# Patient Record
Sex: Female | Born: 1985
Health system: Southern US, Community
[De-identification: ages and names within clinical notes are randomized; demographics above are authoritative.]

## PROBLEM LIST (undated history)

## (undated) ENCOUNTER — Emergency Department (HOSPITAL_COMMUNITY): Payer: Self-pay | Source: Home / Self Care

## (undated) DIAGNOSIS — F329 Major depressive disorder, single episode, unspecified: Secondary | ICD-10-CM

## (undated) DIAGNOSIS — A6000 Herpesviral infection of urogenital system, unspecified: Secondary | ICD-10-CM

## (undated) DIAGNOSIS — F411 Generalized anxiety disorder: Secondary | ICD-10-CM

## (undated) HISTORY — DX: Major depressive disorder, single episode, unspecified: F32.9

## (undated) HISTORY — DX: Generalized anxiety disorder: F41.1

## (undated) HISTORY — PX: NO PAST SURGERIES: SHX2092

## (undated) HISTORY — DX: Herpesviral infection of urogenital system, unspecified: A60.00

---

## 2005-10-23 DIAGNOSIS — R87612 Low grade squamous intraepithelial lesion on cytologic smear of cervix (LGSIL): Secondary | ICD-10-CM

## 2005-10-23 HISTORY — DX: Low grade squamous intraepithelial lesion on cytologic smear of cervix (LGSIL): R87.612

## 2006-01-04 DIAGNOSIS — R87620 Atypical squamous cells of undetermined significance on cytologic smear of vagina (ASC-US): Secondary | ICD-10-CM

## 2006-01-04 HISTORY — PX: COLPOSCOPY: SHX161

## 2006-01-04 HISTORY — DX: Atypical squamous cells of undetermined significance on cytologic smear of vagina (ASC-US): R87.620

## 2008-03-02 ENCOUNTER — Ambulatory Visit: Payer: Self-pay | Admitting: General Surgery

## 2008-03-10 ENCOUNTER — Ambulatory Visit: Payer: Self-pay | Admitting: General Surgery

## 2010-10-05 LAB — HEPATIC FUNCTION PANEL
ALT: 15 U/L (ref 7–35)
AST: 12 U/L — AB (ref 13–35)
Alkaline Phosphatase: 44 U/L (ref 25–125)
Bilirubin, Total: 0.4 mg/dL

## 2010-10-05 LAB — LIPID PANEL
Cholesterol: 237 mg/dL — AB (ref 0–200)
HDL: 65 mg/dL (ref 35–70)
LDL Cholesterol: 145 mg/dL
LDL/HDL RATIO: 2.2
TRIGLYCERIDES: 134 mg/dL (ref 40–160)

## 2010-10-05 LAB — BASIC METABOLIC PANEL
BUN: 13 mg/dL (ref 4–21)
Creatinine: 0.8 mg/dL (ref 0.5–1.1)
Glucose: 86 mg/dL
Potassium: 4.9 mmol/L (ref 3.4–5.3)
SODIUM: 139 mmol/L (ref 137–147)

## 2014-04-23 LAB — CBC AND DIFFERENTIAL
HEMATOCRIT: 41 % (ref 36–46)
HEMOGLOBIN: 14.2 g/dL (ref 12.0–16.0)
NEUTROS ABS: 5 /uL
Platelets: 291 10*3/uL (ref 150–399)
WBC: 9 10^3/mL

## 2016-01-21 DIAGNOSIS — F32A Depression, unspecified: Secondary | ICD-10-CM | POA: Insufficient documentation

## 2016-01-21 DIAGNOSIS — F329 Major depressive disorder, single episode, unspecified: Secondary | ICD-10-CM

## 2016-01-21 DIAGNOSIS — F419 Anxiety disorder, unspecified: Secondary | ICD-10-CM | POA: Insufficient documentation

## 2016-01-24 ENCOUNTER — Encounter: Payer: Self-pay | Admitting: Family Medicine

## 2016-01-24 ENCOUNTER — Other Ambulatory Visit: Payer: Self-pay | Admitting: Family Medicine

## 2016-01-24 ENCOUNTER — Ambulatory Visit (INDEPENDENT_AMBULATORY_CARE_PROVIDER_SITE_OTHER): Payer: 59 | Admitting: Family Medicine

## 2016-01-24 VITALS — BP 118/70 | HR 64 | Temp 98.5°F | Resp 16 | Wt 234.0 lb

## 2016-01-24 DIAGNOSIS — R1011 Right upper quadrant pain: Secondary | ICD-10-CM

## 2016-01-24 DIAGNOSIS — F4323 Adjustment disorder with mixed anxiety and depressed mood: Secondary | ICD-10-CM | POA: Diagnosis not present

## 2016-01-24 LAB — POCT URINALYSIS DIPSTICK
Bilirubin, UA: NEGATIVE
Glucose, UA: NEGATIVE
Ketones, UA: NEGATIVE
LEUKOCYTES UA: NEGATIVE
NITRITE UA: NEGATIVE
PH UA: 5
PROTEIN UA: NEGATIVE
Spec Grav, UA: 1.02
UROBILINOGEN UA: 0.2

## 2016-01-24 LAB — POCT URINE PREGNANCY: PREG TEST UR: NEGATIVE

## 2016-01-24 MED ORDER — SERTRALINE HCL 50 MG PO TABS: 50.0000 mg | ORAL_TABLET | Freq: Every day | ORAL | Status: DC

## 2016-01-24 NOTE — Patient Instructions (Signed)
Major Depressive Disorder Major depressive disorder is a mental illness. It also may be called clinical depression or unipolar depression. Major depressive disorder usually causes feelings of sadness, hopelessness, or helplessness. Some people with this disorder do not feel particularly sad but lose interest in doing things they used to enjoy (anhedonia). Major depressive disorder also can cause physical symptoms. It can interfere with work, school, relationships, and other normal everyday activities. The disorder varies in severity but is longer lasting and more serious than the sadness we all feel from time to time in our lives. Major depressive disorder often is triggered by stressful life events or major life changes. Examples of these triggers include divorce, loss of your job or home, a move, and the death of a family member or close friend. Sometimes this disorder occurs for no obvious reason at all. People who have family members with major depressive disorder or bipolar disorder are at higher risk for developing this disorder, with or without life stressors. Major depressive disorder can occur at any age. It may occur just once in your life (single episode major depressive disorder). It may occur multiple times (recurrent major depressive disorder). SYMPTOMS People with major depressive disorder have either anhedonia or depressed mood on nearly a daily basis for at least 2 weeks or longer. Symptoms of depressed mood include:  Feelings of sadness (blue or down in the dumps) or emptiness.  Feelings of hopelessness or helplessness.  Tearfulness or episodes of crying (may be observed by others).  Irritability (children and adolescents). In addition to depressed mood or anhedonia or both, people with this disorder have at least four of the following symptoms:  Difficulty sleeping or sleeping too much.   Significant change (increase or decrease) in appetite or weight.   Lack of energy or  motivation.  Feelings of guilt and worthlessness.   Difficulty concentrating, remembering, or making decisions.  Unusually slow movement (psychomotor retardation) or restlessness (as observed by others).   Recurrent wishes for death, recurrent thoughts of self-harm (suicide), or a suicide attempt. People with major depressive disorder commonly have persistent negative thoughts about themselves, other people, and the world. People with severe major depressive disorder may experiencedistorted beliefs or perceptions about the world (psychotic delusions). They also may see or hear things that are not real (psychotic hallucinations). DIAGNOSIS Major depressive disorder is diagnosed through an assessment by your health care provider. Your health care provider will ask aboutaspects of your daily life, such as mood,sleep, and appetite, to see if you have the diagnostic symptoms of major depressive disorder. Your health care provider may ask about your medical history and use of alcohol or drugs, including prescription medicines. Your health care provider also may do a physical exam and blood work. This is because certain medical conditions and the use of certain substances can cause major depressive disorder-like symptoms (secondary depression). Your health care provider also may refer you to a mental health specialist for further evaluation and treatment. TREATMENT It is important to recognize the symptoms of major depressive disorder and seek treatment. The following treatments can be prescribed for this disorder:   Medicine. Antidepressant medicines usually are prescribed. Antidepressant medicines are thought to correct chemical imbalances in the brain that are commonly associated with major depressive disorder. Other types of medicine may be added if the symptoms do not respond to antidepressant medicines alone or if psychotic delusions or hallucinations occur.  Talk therapy. Talk therapy can be  helpful in treating major depressive disorder by providing   support, education, and guidance. Certain types of talk therapy also can help with negative thinking (cognitive behavioral therapy) and with relationship issues that trigger this disorder (interpersonal therapy). A mental health specialist can help determine which treatment is best for you. Most people with major depressive disorder do well with a combination of medicine and talk therapy. Treatments involving electrical stimulation of the brain can be used in situations with extremely severe symptoms or when medicine and talk therapy do not work over time. These treatments include electroconvulsive therapy, transcranial magnetic stimulation, and vagal nerve stimulation.   This information is not intended to replace advice given to you by your health care provider. Make sure you discuss any questions you have with your health care provider.   Document Released: 02/03/2013 Document Revised: 10/30/2014 Document Reviewed: 02/03/2013 Elsevier Interactive Patient Education 2016 [employer] Inc[employer] Generalized Anxiety Disorder Generalized anxiety disorder (GAD) is a mental disorder. It interferes with life functions, including relationships, work, and school. GAD is different from normal anxiety, which everyone experiences at some point in their lives in response to specific life events and activities. Normal anxiety actually helps [place] prepare for and get through these life events and activities. Normal anxiety goes away after the event or activity is over.  GAD causes anxiety that is not necessarily related to specific events or activities. It also causes excess anxiety in proportion to specific events or activities. The anxiety associated with GAD is also difficult to control. GAD can vary from mild to severe. People with severe GAD can have intense waves of anxiety with physical symptoms (panic attacks).  SYMPTOMS The anxiety and worry associated with GAD  are difficult to control. This anxiety and worry are related to many life events and activities and also occur more days than not for 6 months or longer. People with GAD also have three or more of the following symptoms (one or more in children):  Restlessness.   Fatigue.  Difficulty concentrating.   Irritability.  Muscle tension.  Difficulty sleeping or unsatisfying sleep. DIAGNOSIS GAD is diagnosed through an assessment by your health care provider. Your health care provider will ask you questions aboutyour mood,physical symptoms, and events in your life. Your health care provider may ask you about your medical history and use of alcohol or drugs, including prescription medicines. Your health care provider may also do a physical exam and blood tests. Certain medical conditions and the use of certain substances can cause symptoms similar to those associated with GAD. Your health care provider may refer you to a mental health specialist for further evaluation. TREATMENT The following therapies are usually used to treat GAD:   Medication. Antidepressant medication usually is prescribed for long-term daily control. Antianxiety medicines may be added in severe cases, especially when panic attacks occur.   Talk therapy (psychotherapy). Certain types of talk therapy can be helpful in treating GAD by providing support, education, and guidance. A form of talk therapy called cognitive behavioral therapy can teach you healthy ways to think about and react to daily life events and activities.  Stress managementtechniques. These include yoga, meditation, and exercise and can be very helpful when they are practiced regularly. A mental health specialist can help determine which treatment is best for you. Some people see improvement with one therapy. However, other people require a combination of therapies.   This information is not intended to replace advice given to you by your health care  provider. Make sure you discuss any questions you have with your  with your health care provider. °  °Document Released: 02/03/2013 Document Revised: 10/30/2014 Document Reviewed: 02/03/2013 °Elsevier Interactive Patient Education ©2016 Elsevier Inc. ° °

## 2016-01-24 NOTE — Progress Notes (Signed)
Patient ID: Monique Mcmahon, female   DOB: [birth year], 30 y.o.   MRN: [id number]       Patient: Monique Mcmahon Female    DOB: 11-05-1985   30 y.o.   MRN: [id number] Visit Date: 01/24/2016  Today's Provider: Vernie Murders, PA   Chief Complaint  Patient presents with  . Emesis   Subjective:    HPI Patient comes in office today with concerns of vomiting. Patient reports that episodes have been intermittent, she denies any certain foods or stress or G.I upset triggering these episodes. Patient reports last incident occurred a week ago after drinking a lime flavored beverage. Patient denies any history of acid reflux.    History reviewed. No pertinent past medical history. Patient Active Problem List   Diagnosis Date Noted  . Acute anxiety 01/21/2016  . Depression 01/21/2016   Past Surgical History  Procedure Laterality Date  . No past surgeries     Family History  Problem Relation Age of Onset  . Anemia Mother   . Ulcerative colitis Sister   . Parkinson's disease Maternal Grandmother   . Prostate cancer Maternal Grandfather    No Known Allergies Previous Medications   ETONOGESTREL-ETHINYL ESTRADIOL (NUVARING) 0.12-0.015 MG/24HR VAGINAL RING    Place 1 each vaginally every 28 (twenty-eight) days. Insert vaginally and leave in place for 3 consecutive weeks, then remove for 1 week.    Review of Systems  Constitutional: Negative.   HENT: Negative.   Eyes: Negative.   Respiratory: Negative.   Cardiovascular: Negative.   Endocrine: Negative.   Genitourinary: Positive for flank pain. Negative for dysuria, urgency, frequency, hematuria, decreased urine volume, vaginal bleeding, vaginal discharge, enuresis, difficulty urinating, genital sores, vaginal pain, menstrual problem, pelvic pain and dyspareunia.  Musculoskeletal: Positive for back pain. Negative for myalgias, joint swelling, arthralgias, gait problem and neck stiffness.  Skin: Negative.   Allergic/Immunologic:  Negative.   Neurological: Negative.   Hematological: Negative.   Psychiatric/Behavioral: Negative.     Social History  Substance Use Topics  . Smoking status: Current Every Day Smoker -- 0.50 packs/day    Types: Cigarettes  . Smokeless tobacco: Never Used  . Alcohol Use: Not on file   Objective:   BP 118/70 mmHg  Pulse 64  Temp(Src) 98.5 F (36.9 C) (Oral)  Resp 16  Wt 234 lb (106.142 kg)  LMP 01/24/2016  Physical Exam  Constitutional: She is oriented to person, place, and time. She appears well-developed and well-nourished.  HENT:  Head: Normocephalic.  Right Ear: External ear normal.  Left Ear: External ear normal.  Mouth/Throat: Oropharynx is clear and moist.  Eyes: Conjunctivae and EOM are normal.  Neck: Neck supple. No thyromegaly present.  Cardiovascular: Normal rate, regular rhythm and normal heart sounds.   Pulmonary/Chest: Breath sounds normal.  Abdominal: Soft. Bowel sounds are normal. There is tenderness.  Mild tenderness in RUQ to deep palpation. BS wnl. No masses.  Neurological: She is alert and oriented to person, place, and time.  Psychiatric: Her behavior is normal. Thought content normal. Her mood appears anxious.      Assessment & Plan:     1. RUQ abdominal pain Onset with intermittent episodes of vomiting over the past 2 months. No dyspepsia, hematemesis, melena, gas, diarrhea or nausea. Vomiting usually occurs with triggering gag reflex accidentally without RUQ pain. No association with food or meal time. LMP started today - using Nuvaring the past couple years. Tender in RUQ and worried about family history - mother had GB disease and  sister had total colectomy for ulcerative colitis. May use Bonine/Meclizine prn nausea and Zantac if any dyspepsia. Schedule labs and RUQ ultrasound.  - Comprehensive metabolic panel - CBC with Differential/Platelet - US Abdomen Limited RUQ - Lipase - POCT urinalysis dipstick - POCT urine pregnancy  2. Adjustment  reaction with anxiety and depression Onset over the past 2 months. Having very anxious episodes and sadness with spontaneous crying spells. Sleeping 10 hours a night but interrupted. 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 Denies suicidal ideation/planning. Will treat with Zoloft and check routine labs for metabolic disorder. - TSH - sertraline (ZOLOFT) 50 MG tablet; Take 1 tablet (50 mg total) by mouth daily.  Dispense: 30 tablet; Refill: [place], [facility]

## 2016-01-25 LAB — COMPREHENSIVE METABOLIC PANEL
A/G RATIO: 1.5 (ref 1.2–2.2)
ALT: 14 IU/L (ref 0–32)
AST: 17 IU/L (ref 0–40)
Albumin: 4.2 g/dL (ref 3.5–5.5)
Alkaline Phosphatase: 37 IU/L — ABNORMAL LOW (ref 39–117)
BUN/Creatinine Ratio: 14 (ref 9–23)
BUN: 10 mg/dL (ref 6–20)
Bilirubin Total: 0.2 mg/dL (ref 0.0–1.2)
CALCIUM: 9.2 mg/dL (ref 8.7–10.2)
CO2: 20 mmol/L (ref 18–29)
CREATININE: 0.69 mg/dL (ref 0.57–1.00)
Chloride: 103 mmol/L (ref 96–106)
GFR calc Af Amer: 135 mL/min/{1.73_m2} (ref >59)
GFR, EST NON AFRICAN AMERICAN: 117 mL/min/{1.73_m2} (ref >59)
GLOBULIN, TOTAL: 2.8 g/dL (ref 1.5–4.5)
Glucose: 87 mg/dL (ref 65–99)
POTASSIUM: 5 mmol/L (ref 3.5–5.2)
SODIUM: 139 mmol/L (ref 134–144)
TOTAL PROTEIN: 7 g/dL (ref 6.0–8.5)

## 2016-01-25 LAB — CBC WITH DIFFERENTIAL/PLATELET
BASOS: 0 %
Basophils Absolute: 0 10*3/uL (ref 0.0–0.2)
EOS (ABSOLUTE): 0.2 10*3/uL (ref 0.0–0.4)
EOS: 3 %
Hematocrit: 40.2 % (ref 34.0–46.6)
Hemoglobin: 13.6 g/dL (ref 11.1–15.9)
IMMATURE GRANS (ABS): 0 10*3/uL (ref 0.0–0.1)
IMMATURE GRANULOCYTES: 0 %
LYMPHS: 38 %
Lymphocytes Absolute: 2.6 10*3/uL (ref 0.7–3.1)
MCH: 31 pg (ref 26.6–33.0)
MCHC: 33.8 g/dL (ref 31.5–35.7)
MCV: 92 fL (ref 79–97)
MONOS ABS: 0.4 10*3/uL (ref 0.1–0.9)
Monocytes: 6 %
NEUTROS PCT: 53 %
Neutrophils Absolute: 3.6 10*3/uL (ref 1.4–7.0)
PLATELETS: 231 10*3/uL (ref 150–379)
RBC: 4.39 x10E6/uL (ref 3.77–5.28)
RDW: 13.2 % (ref 12.3–15.4)
WBC: 6.9 10*3/uL (ref 3.4–10.8)

## 2016-01-25 LAB — TSH: TSH: 0.891 u[IU]/mL (ref 0.450–4.500)

## 2016-01-25 LAB — LIPASE: LIPASE: 19 U/L (ref 0–59)

## 2016-01-26 ENCOUNTER — Telehealth: Payer: Self-pay

## 2016-01-26 ENCOUNTER — Ambulatory Visit
Admission: RE | Admit: 2016-01-26 | Discharge: 2016-01-26 | Disposition: A | Discharge status: Home / Self Care | Payer: 59 | Source: Ambulatory Visit | Attending: Family Medicine | Admitting: Family Medicine

## 2016-01-26 DIAGNOSIS — R1011 Right upper quadrant pain: Secondary | ICD-10-CM | POA: Diagnosis not present

## 2016-01-26 NOTE — Telephone Encounter (Signed)
-----   Message from Margo Common, [place] sent at 01/26/2016  3:36 PM EDT ----- Normal ultrasound without signs of gallstones or gallbladder disease. May use Zantac 150 mg BID and Gas-X for gas with Probiotic supplement for better digestion. If abdominal discomfort persists after using this treatment for a week, should schedule for referral to gastroenterologist.

## 2016-01-26 NOTE — Telephone Encounter (Signed)
LMTCB

## 2016-01-26 NOTE — Telephone Encounter (Signed)
-----   Message from Margo Common, [place] sent at 01/26/2016  9:09 AM EDT ----- All blood tests normal. Proceed with ultrasound study.

## 2016-01-27 NOTE — Telephone Encounter (Signed)
Patient advised as directed below. Patient verbalized understanding and agrees with plan of care.  

## 2016-04-06 IMAGING — US US ABDOMEN LIMITED
1 series · 14 of 25 positions shown · non-contrast
Comparison: No prior.

CLINICAL DATA: Right upper quadrant pain.  Vomiting.

EXAM:
US ABDOMEN LIMITED - RIGHT UPPER QUADRANT

[Series 1: us abdomen limited · 0.26mm/px · 14 of 54 slices shown]
[im 1/54]
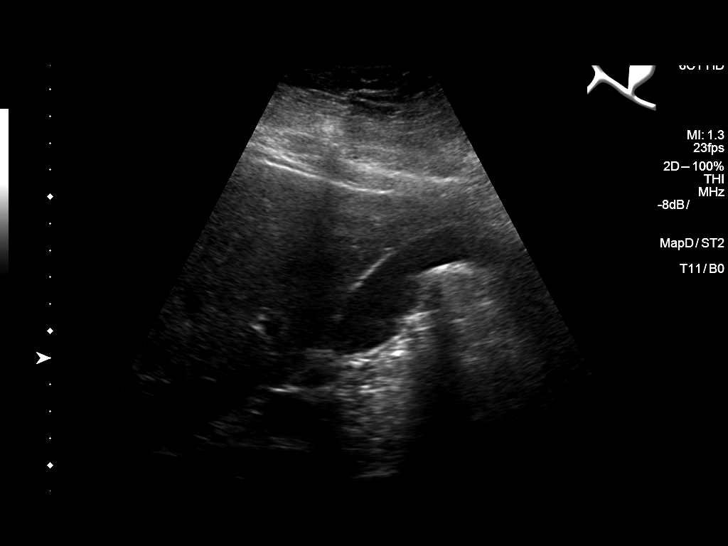
[im 5/54]
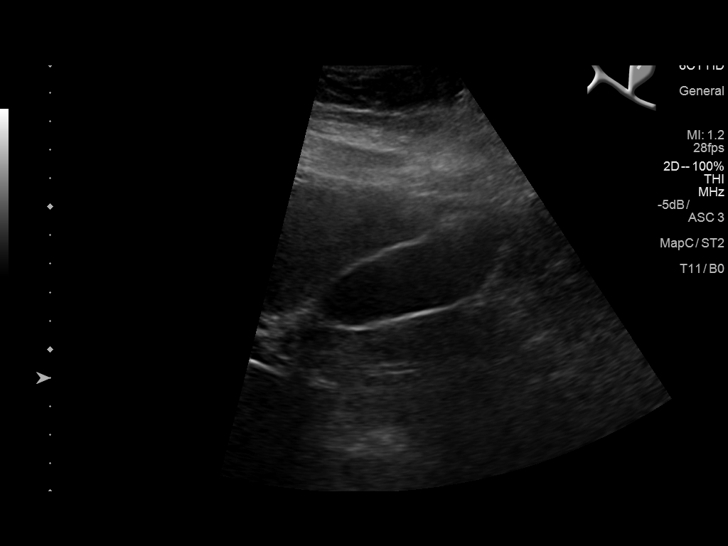
[im 9/54]
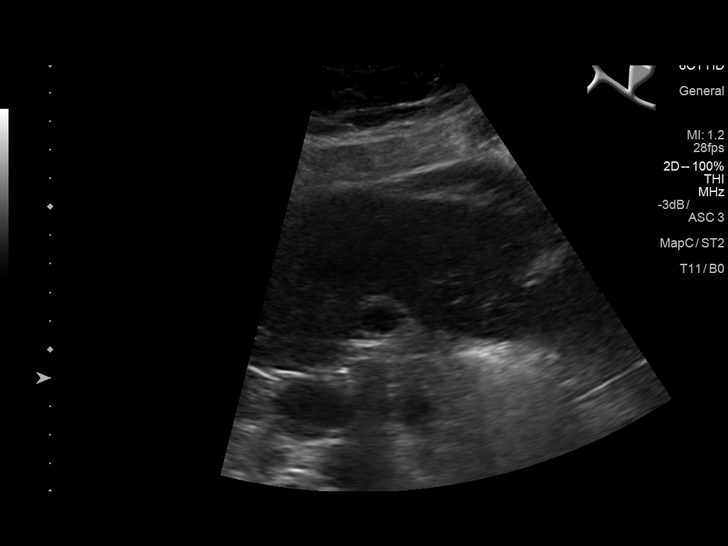
[im 14/54]
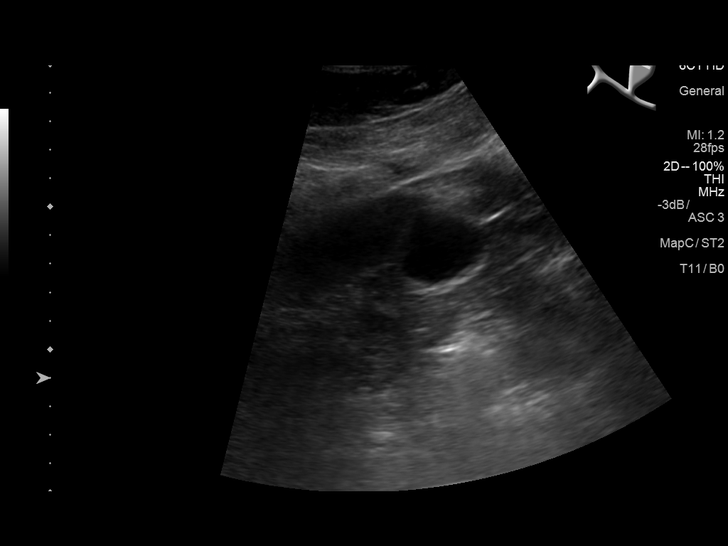
[im 18/54]
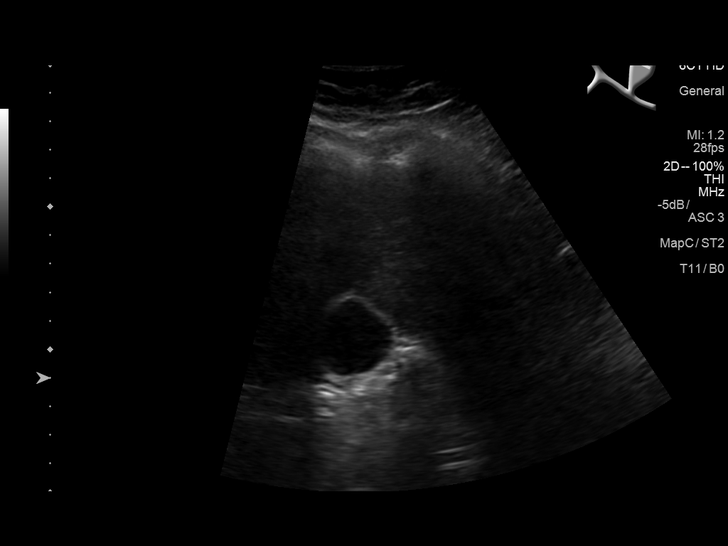
[im 20/54]
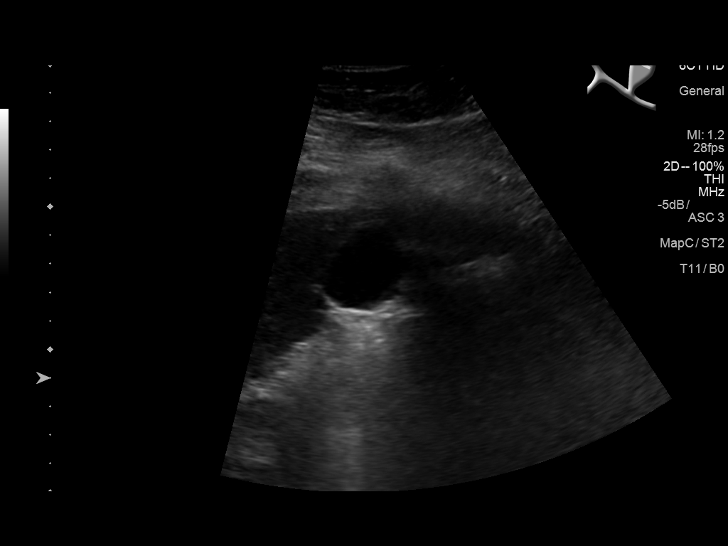
[im 25/54]
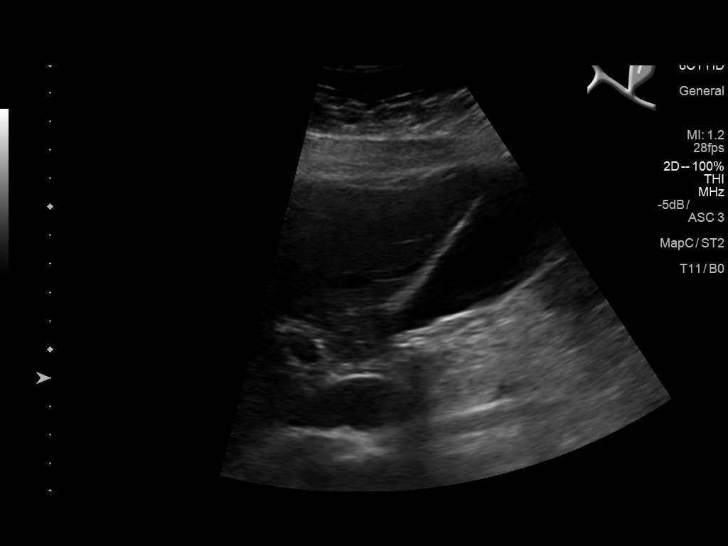
[im 29/54]
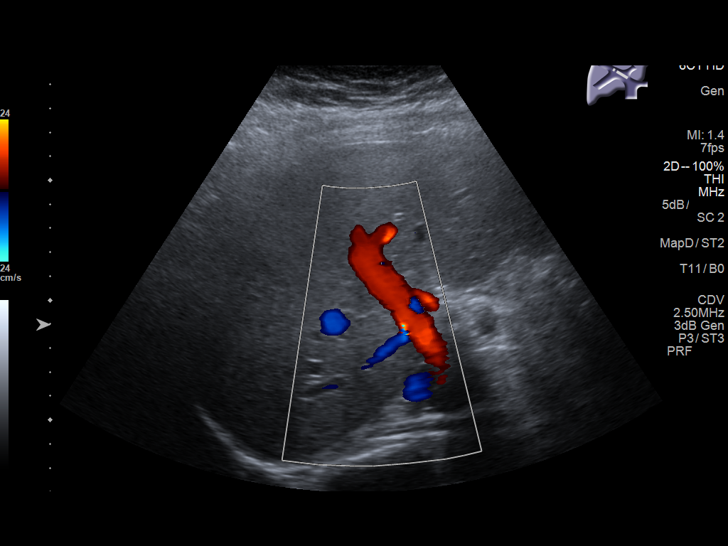
[im 34/54]
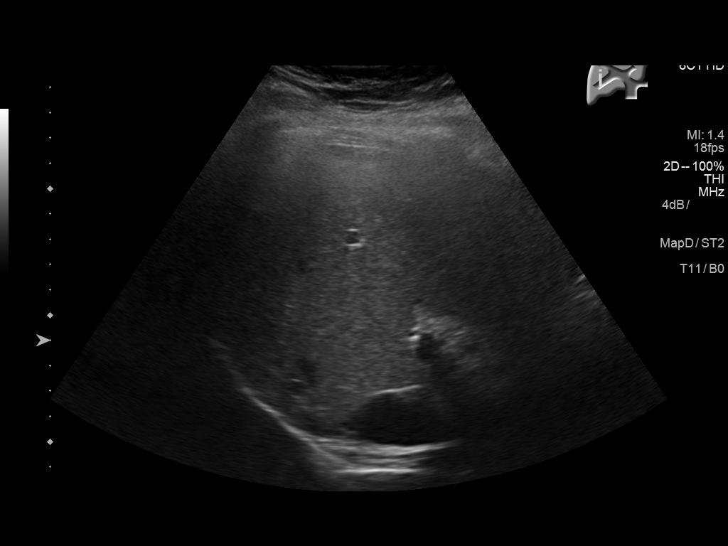
[im 36/54]
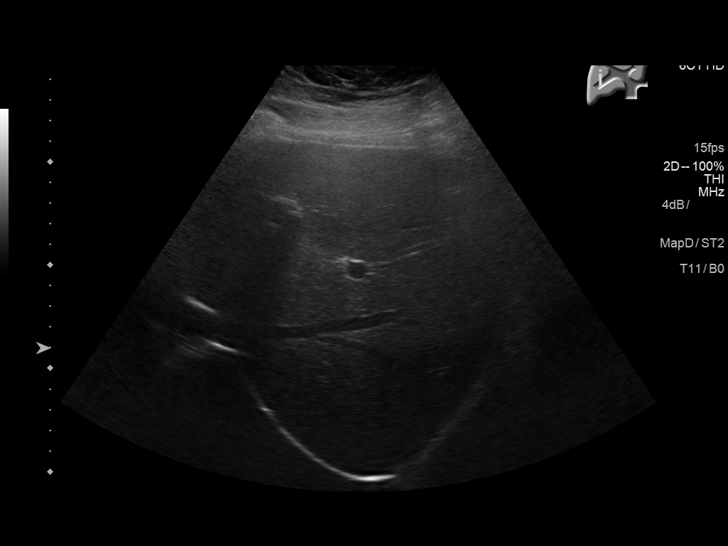
[im 40/54]
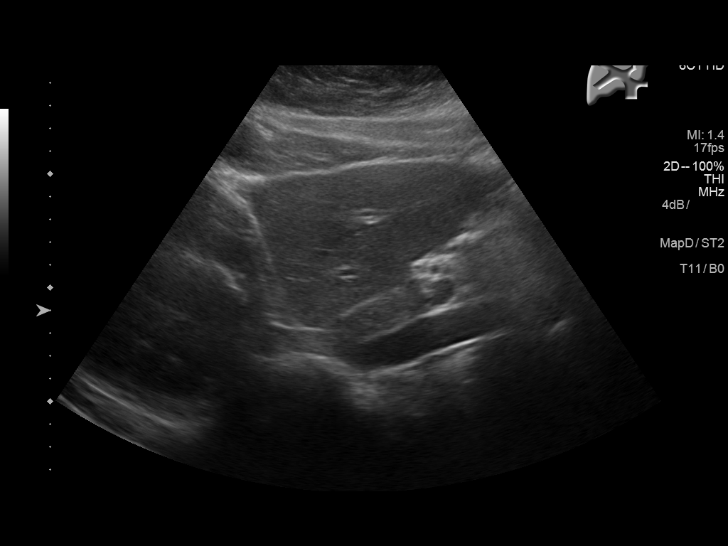
[im 45/54]
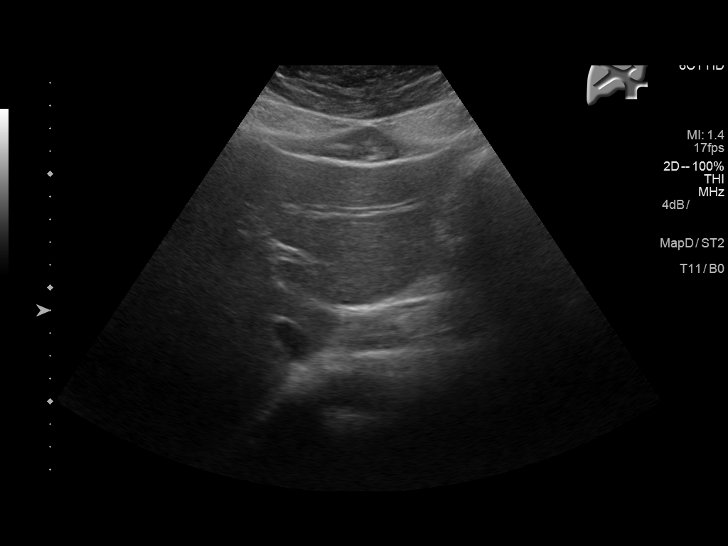
[im 49/54]
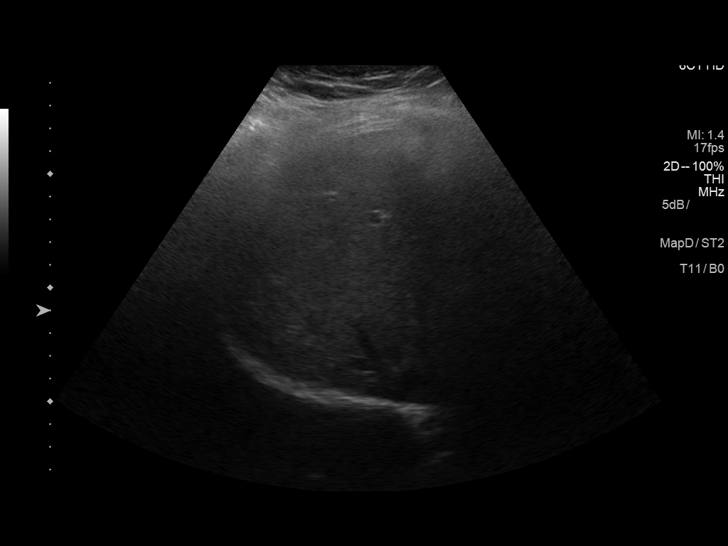
[im 54/54]
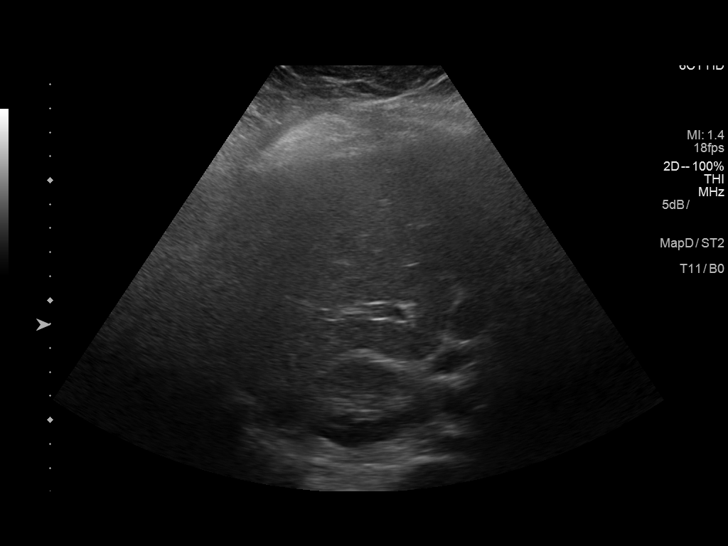

[14 of 25 positions shown; findings below may reference images not displayed]

FINDINGS: Gallbladder:

No gallstones or wall thickening visualized. No sonographic Murphy
sign noted by sonographer.

Common bile duct:

Diameter: 2.7 mm

Liver:

No focal lesion identified. Within normal limits in parenchymal
echogenicity.
IMPRESSION: Negative exam.

## 2016-10-24 ENCOUNTER — Encounter: Payer: Self-pay | Admitting: Physician Assistant

## 2016-10-24 ENCOUNTER — Ambulatory Visit (INDEPENDENT_AMBULATORY_CARE_PROVIDER_SITE_OTHER): Payer: 59 | Admitting: Physician Assistant

## 2016-10-24 VITALS — BP 128/82 | HR 68 | Temp 98.5°F | Resp 16 | Wt 229.0 lb

## 2016-10-24 DIAGNOSIS — Z9109 Other allergy status, other than to drugs and biological substances: Secondary | ICD-10-CM | POA: Diagnosis not present

## 2016-10-24 DIAGNOSIS — R21 Rash and other nonspecific skin eruption: Secondary | ICD-10-CM | POA: Diagnosis not present

## 2016-10-24 MED ORDER — PREDNISONE 10 MG (21) PO TBPK: ORAL_TABLET | ORAL | 0 refills | Status: DC

## 2016-10-24 NOTE — Patient Instructions (Signed)
Rash A rash is a change in the color of the skin. A rash can also change the way your skin feels. There are many different conditions and factors that can cause a rash. Follow these instructions at home: Pay attention to any changes in your symptoms. Follow these instructions to help with your condition: Medicine Take or apply over-the-counter and prescription medicines only as told by your health care provider. These may include:  Corticosteroid cream.  Anti-itch lotions.  Oral antihistamines.  Skin Care  Apply cool compresses to the affected areas.  Try taking a bath with: ? Epsom salts. Follow the instructions on the packaging. You can get these at your local pharmacy or grocery store. ? Baking soda. Pour a small amount into the bath as told by your health care provider. ? Colloidal oatmeal. Follow the instructions on the packaging. You can get this at your local pharmacy or grocery store.  Try applying baking soda paste to your skin. Stir water into baking soda until it reaches a paste-like consistency.  Do not scratch or rub your skin.  Avoid covering the rash. Make sure the rash is exposed to air as much as possible. General instructions  Avoid hot showers or baths, which can make itching worse. A cold shower may help.  Avoid scented soaps, detergents, and perfumes. Use gentle soaps, detergents, perfumes, and other cosmetic products.  Avoid any substance that causes your rash. Keep a journal to help track what causes your rash. Write down: ? What you eat. ? What cosmetic products you use. ? What you drink. ? What you wear. This includes jewelry.  Keep all follow-up visits as told by your health care provider. This is important. Contact a health care provider if:  You sweat at night.  You lose weight.  You urinate more than normal.  You feel weak.  You vomit.  Your skin or the whites of your eyes look yellow (jaundice).  Your skin: ? Tingles. ? Is  numb.  Your rash: ? Does not go away after several days. ? Gets worse.  You are: ? Unusually thirsty. ? More tired than normal.  You have: ? New symptoms. ? Pain in your abdomen. ? A fever. ? Diarrhea. Get help right away if:  You develop a rash that covers all or most of your body. The rash may or may not be painful.  You develop blisters that: ? Are on top of the rash. ? Grow larger or grow together. ? Are painful. ? Are inside your nose or mouth.  You develop a rash that: ? Looks like purple pinprick-sized spots all over your body. ? Has a "bull's eye" or looks like a target. ? Is not related to sun exposure, is red and painful, and causes your skin to peel. This information is not intended to replace advice given to you by your health care provider. Make sure you discuss any questions you have with your health care provider. Document Released: 09/29/2002 Document Revised: 03/14/2016 Document Reviewed: 02/24/2015 Elsevier Interactive Patient Education  2017 [employer].  

## 2016-10-24 NOTE — Progress Notes (Signed)
Patient: Monique Mcmahon Female    DOB: [birth year]   31 y.o.   MRN: [id number] Visit Date: 10/24/2016  Today's Provider: Trinna Post, PA-C   Chief Complaint  Patient presents with  . Rash    Started Saturday   Subjective:    Rash  This is a new problem. The current episode started in the past 7 days. The problem has been gradually worsening since onset. The affected locations include the back, abdomen and left upper leg. The rash is characterized by redness and itchiness. She was exposed to nothing. Associated symptoms include congestion.   Patient is a 31 y/o female on Nuvaring and Zoloft presenting to the clinic with a rash ongoing for four days. She says the rash started as a small bump on her back and then spread to her abdomen and legs. It is extremely itchy. 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 There is no pus or weeping. She has never had this before. She has some URI symptoms currently but not prior to this. She has been taking Benadryl with some relief. Rash is not painful.   Also would like to see if she can see allergist because she has new allergies since college such as dogs that she never had before and is wondering if this could be contributing to her rash.     No Known Allergies   Current Outpatient Prescriptions:  .  etonogestrel-ethinyl estradiol (NUVARING) 0.12-0.015 MG/24HR vaginal ring, Place 1 each vaginally every 28 (twenty-eight) days. Insert vaginally and leave in place for 3 consecutive weeks, then remove for 1 week., Disp: , Rfl:  .  predniSONE (STERAPRED UNI-PAK 21 TAB) 10 MG (21) TBPK tablet, Take 6, 5, 4, 3, 2, 1., Disp: 21 tablet, Rfl: 0 .  sertraline (ZOLOFT) 50 MG tablet, Take 1 tablet (50 mg total) by mouth daily. (Patient not taking: Reported on 10/24/2016), Disp: 30 tablet, Rfl: 3  Review of Systems  Constitutional: Negative.   HENT: Positive  for congestion.   Musculoskeletal: Positive for myalgias.  Skin: Positive for rash. Negative for color change, pallor and wound.    Social History  Substance Use Topics  . Smoking status: Current Every Day Smoker    Packs/day: 0.50    Types: Cigarettes  . Smokeless tobacco: Never Used  . Alcohol use Not on file   Objective:   BP 128/82 (BP Location: Left Arm, Patient Position: Sitting, Cuff Size: Large)   Pulse 68   Temp 98.5 F (36.9 C) (Oral)   Resp 16   Wt 229 lb (103.9 kg)   LMP 09/22/2016   BMI 36.41 kg/m   Physical Exam  Constitutional: She is oriented to person, place, and time. She appears well-developed and well-nourished.  Lymphadenopathy:    She has no cervical adenopathy.  Neurological: She is alert and oriented to person, place, and time.  Skin: Skin is warm and dry. Rash noted. There is erythema.  Maculopapular rash concentrated on lumbar back but also present throughout entire trunk and bilateral legs. Uniformly red, non-scaling maculopapular lesions ranging in size from 0.5cm to 1.5 cm in diameter. No central clearing. Do not appear urticarial. Some evidence of excoriation. No dermatomal pattern. No bleeding, weeping, or pus drainage.   Psychiatric: She has a normal mood and affect. Her behavior is normal.        Assessment & Plan:  1. Maculopapular rash  Unclear etiology. Does not appear viral in nature. No dermatomal pattern to indicate shingles or along cleavage lines to indicate pityriasis rosea. No scaling to indicate guttate psoriasis or eczema. Will give steroid taper to see if this reduces inflammation and itching. Patient to call back if worsening.   - predniSONE (STERAPRED UNI-PAK 21 TAB) 10 MG (21) TBPK tablet; Take 6, 5, 4, 3, 2, 1.  Dispense: 21 tablet; Refill: 0  2. Environmental allergies  Will refer as below for environment of allergies and also investigation if rash may be related.   - Ambulatory referral to Allergy  No Follow-up on  file.   Patient Instructions  Rash A rash is a change in the color of the skin. A rash can also change the way your skin feels. There are many different conditions and factors that can cause a rash. Follow these instructions at home: Pay attention to any changes in your symptoms. Follow these instructions to help with your condition: Medicine  Take or apply over-the-counter and prescription medicines only as told by your health care provider. These may include:  Corticosteroid cream.  Anti-itch lotions.  Oral antihistamines. Skin Care  Apply cool compresses to the affected areas.  Try taking a bath with:  Epsom salts. Follow the instructions on the packaging. You can get these at your local pharmacy or grocery store.  Baking soda. Pour a small amount into the bath as told by your health care provider.  Colloidal oatmeal. Follow the instructions on the packaging. You can get this at your local pharmacy or grocery store.  Try applying baking soda paste to your skin. Stir water into baking soda until it reaches a paste-like consistency.  Do not scratch or rub your skin.  Avoid covering the rash. Make sure the rash is exposed to air as much as possible. General instructions  Avoid hot showers or baths, which can make itching worse. A cold shower may help.  Avoid scented soaps, detergents, and perfumes. Use gentle soaps, detergents, perfumes, and other cosmetic products.  Avoid any substance that causes your rash. Keep a journal to help track what causes your rash. Write down:  What you eat.  What cosmetic products you use.  What you drink.  What you wear. This includes jewelry.  Keep all follow-up visits as told by your health care provider. This is important. Contact a health care provider if:  You sweat at night.  You lose weight.  You urinate more than normal.  You feel weak.  You vomit.  Your skin or the whites of your eyes look yellow (jaundice).  Your  skin:  Tingles.  Is numb.  Your rash:  Does not go away after several days.  Gets worse.  You are:  Unusually thirsty.  More tired than normal.  You have:  New symptoms.  Pain in your abdomen.  A fever.  Diarrhea. Get help right away if:  You develop a rash that covers all or most of your body. The rash may or may not be painful.  You develop blisters that:  Are on top of the rash.  Grow larger or grow together.  Are painful.  Are inside your nose or mouth.  You develop a rash that:  Looks like purple pinprick-sized spots all over your body.  Has a "bull's eye" or looks like a target.  Is not related to sun exposure, is red and painful, and causes your skin to peel. This information is not  intended to replace advice given to you by your health care provider. Make sure you discuss any questions you have with your health care provider. Document Released: 09/29/2002 Document Revised: 03/14/2016 Document Reviewed: 02/24/2015 Elsevier Interactive Patient Education  2017 [employer].    The entirety of the information documented in the History of Present Illness, Review of Systems and Physical Exam were personally obtained by me. Portions of this information were initially documented by Ashley Royalty, CMA and reviewed by me for thoroughness and accuracy.        Trinna Post, PA-C  [facility]

## 2016-11-22 DIAGNOSIS — N72 Inflammatory disease of cervix uteri: Secondary | ICD-10-CM | POA: Diagnosis not present

## 2016-11-22 DIAGNOSIS — R87612 Low grade squamous intraepithelial lesion on cytologic smear of cervix (LGSIL): Secondary | ICD-10-CM | POA: Diagnosis not present

## 2016-12-05 DIAGNOSIS — J309 Allergic rhinitis, unspecified: Secondary | ICD-10-CM | POA: Diagnosis not present

## 2016-12-05 DIAGNOSIS — R21 Rash and other nonspecific skin eruption: Secondary | ICD-10-CM | POA: Diagnosis not present

## 2016-12-05 DIAGNOSIS — H1045 Other chronic allergic conjunctivitis: Secondary | ICD-10-CM | POA: Diagnosis not present

## 2019-09-29 NOTE — Progress Notes (Signed)
PCP:  Margo Common, PA   Chief Complaint  Patient presents with  . Gynecologic Exam  . Contraception    Nexplanon replacement     HPI:      [name] is a 33 y.o. No obstetric history on file. who LMP was No LMP recorded. Patient has had an implant., presents today for her annual examination.  Her menses are irregular with nexplanon. Had been infrequent spotting prior to 6 months ago. Now has 2 wks light bleeding QO month.   Dysmenorrhea mild, occurring first 1-2 days of flow.   Sex activity: single partner, contraception - Nexplanon placed 11/29/16. Would like replacement.  Last Pap: October 19, 2016  Results were: low-grade squamous intraepithelial neoplasia (LGSIL - encompassing HPV,mild dysplasia,CIN I) /neg HPV DNA. Neg colpo bx with Dr. Georgianne Fick 1/18. No repeat pap since. Due today. Also with LGSIL 2016 and neg colpo bx 2017.  Hx of STDs: HSV  There is no FH of breast cancer. There is no FH of ovarian cancer. The patient does do self-breast exams.  Tobacco use: smokes 1 ppd, not ready to quit Alcohol use: few days weekly Daily marijuana use.  Exercise: not active  She does get adequate calcium but not Vitamin D in her diet.   Patient Active Problem List   Diagnosis Date Noted  . Acute anxiety 01/21/2016  . Depression 01/21/2016    Past Surgical History:  Procedure Laterality Date  . COLPOSCOPY  01/04/2006    Family History  Problem Relation Age of Onset  . Anemia Mother   . Hypertension Father   . Ulcerative colitis Sister   . Parkinson's disease Maternal Grandmother   . Prostate cancer Maternal Grandfather     Social History   Socioeconomic History  . Marital status: Single    Spouse name: Not on file  . Number of children: Not on file  . Years of education: Not on file  . Highest education level: Not on file  Occupational History  . Not on file  Social Needs  . Financial resource strain: Not on file  . Food insecurity     Worry: Not on file    Inability: Not on file  . Transportation needs    Medical: Not on file    Non-medical: Not on file  Tobacco Use  . Smoking status: Current Every Day Smoker    Packs/day: 0.50    Types: Cigarettes  . Smokeless tobacco: Never Used  Substance and Sexual Activity  . Alcohol use: Yes    Alcohol/week: 0.0 standard drinks  . Drug use: Never  . Sexual activity: Yes    Birth control/protection: Implant  Lifestyle  . Physical activity    Days per week: Not on file    Minutes per session: Not on file  . Stress: Not on file  Relationships  . Social Herbalist on phone: Not on file    Gets together: Not on file    Attends religious service: Not on file    Active member of club or organization: Not on file    Attends meetings of clubs or organizations: Not on file    Relationship status: Not on file  . Intimate partner violence    Fear of current or ex partner: Not on file    Emotionally abused: Not on file    Physically abused: Not on file    Forced sexual activity: Not on file  Other Topics Concern  . Not  on file  Social History Narrative  . Not on file     Current Outpatient Medications:  .  etonogestrel (NEXPLANON) 68 MG IMPL implant, 1 each (68 mg total) by Subdermal route once for 1 dose., Disp: 1 each, Rfl: 0   ROS:  Review of Systems  Constitutional: Negative for fatigue, fever and unexpected weight change.  Respiratory: Negative for cough, shortness of breath and wheezing.   Cardiovascular: Negative for chest pain, palpitations and leg swelling.  Gastrointestinal: Negative for blood in stool, constipation, diarrhea, nausea and vomiting.  Endocrine: Negative for cold intolerance, heat intolerance and polyuria.  Genitourinary: Negative for dyspareunia, dysuria, flank pain, frequency, genital sores, hematuria, menstrual problem, pelvic pain, urgency, vaginal bleeding, vaginal discharge and vaginal pain.  Musculoskeletal: Negative for back  pain, joint swelling and myalgias.  Skin: Negative for rash.  Neurological: Negative for dizziness, syncope, light-headedness, numbness and headaches.  Hematological: Negative for adenopathy.  Psychiatric/Behavioral: Negative for agitation, confusion, sleep disturbance and suicidal ideas. The patient is not nervous/anxious.   BREAST: No symptoms   Objective: BP 118/80   Ht 5\' 6"  (1.676 m)   Wt 245 lb (111.1 kg)   BMI 39.54 kg/m    Physical Exam Constitutional:      Appearance: She is well-developed.  Genitourinary:     Vulva, vagina, cervix, uterus, right adnexa and left adnexa normal.     No vulval lesion or tenderness noted.     No vaginal discharge, erythema or tenderness.     No cervical polyp.     Uterus is not enlarged or tender.     No right or left adnexal mass present.     Right adnexa not tender.     Left adnexa not tender.  Neck:     Musculoskeletal: Normal range of motion.     Thyroid: No thyromegaly.  Cardiovascular:     Rate and Rhythm: Normal rate and regular rhythm.     Heart sounds: Normal heart sounds. No murmur.  Pulmonary:     Effort: Pulmonary effort is normal.     Breath sounds: Normal breath sounds.  Chest:     Breasts:        Right: No mass, nipple discharge, skin change or tenderness.        Left: No mass, nipple discharge, skin change or tenderness.  Abdominal:     Palpations: Abdomen is soft.     Tenderness: There is no abdominal tenderness. There is no guarding.  Musculoskeletal: Normal range of motion.  Neurological:     General: No focal deficit present.     Mental Status: She is alert and oriented to person, place, and time.     Cranial Nerves: No cranial nerve deficit.  Skin:    General: Skin is warm and dry.  Psychiatric:        Mood and Affect: Mood normal.        Behavior: Behavior normal.        Thought Content: Thought content normal.        Judgment: Judgment normal.  Vitals signs reviewed.    Nexplanon  removal Procedure note - The Nexplanon was noted in the patient's arm and the end was identified. The skin was cleansed with a Betadine solution. A small injection of subcutaneous lidocaine with epinephrine was given over the end of the implant. An incision was made at the end of the implant. The rod was noted in the incision and grasped with a hemostat. It was noted  to be intact.  Steri-Strip was placed approximating the incision. Hemostasis was noted.  Nexplanon Insertion  Patient given informed consent, signed copy in the chart, time out was performed.  Appropriate time out taken.  Patient's LEFT arm was prepped and draped in the usual sterile fashion. The ruler used to measure and mark insertion area.  Pt was prepped with betadine swab and then injected with 1.0 cc of 2% lidocaine with epinephrine. Nexplanon removed form packaging,  Device confirmed in needle, then inserted full length of needle and withdrawn per handbook instructions.  Pt insertion site covered with steri-strip and a bandage.   Minimal blood loss.  Pt tolerated the procedure well.  Assessment/Plan: Encounter for annual routine gynecological examination  Cervical cancer screening - Plan: [facility] PAP  Screening for HPV (human papillomavirus) - Plan: [facility] P[facility]  LGSIL on Pap smear of cervix - Plan: [facility] PAP; repeat pap today. Will call with results.   Encounter for removal and reinsertion of Nexplanon - Plan: etonogestrel (NEXPLANON) 79 MG IMPL implant  Meds ordered this encounter  Medications  . etonogestrel (NEXPLANON) 68 MG IMPL implant    Sig: 1 each (68 mg total) by Subdermal route once for 1 dose.    Dispense:  1 each    Refill:  0    Order Specific Question:   Supervising Provider    Answer:   Gae Dry [id number]    She was told to remove the dressing in 12-24 hours, to keep the incision area dry for 24 hours and to remove the Steristrip in 2-3  days.  Notify us if any signs of tenderness, redness, pain, or fevers  develop.          GYN counsel adequate intake of calcium and vitamin D, diet and exercise     F/U  Return in about 1 year (around 09/29/2020).  Alicia B. Copland, PA-C 09/30/2019 9:07 AM

## 2019-09-30 ENCOUNTER — Other Ambulatory Visit: Payer: Self-pay

## 2019-09-30 ENCOUNTER — Encounter: Payer: Self-pay | Admitting: Obstetrics and Gynecology

## 2019-09-30 ENCOUNTER — Other Ambulatory Visit (HOSPITAL_COMMUNITY)
Admission: RE | Admit: 2019-09-30 | Discharge: 2019-09-30 | Disposition: A | Discharge status: Home / Self Care | Payer: 59 | Source: Ambulatory Visit | Attending: Obstetrics and Gynecology | Admitting: Obstetrics and Gynecology

## 2019-09-30 ENCOUNTER — Ambulatory Visit (INDEPENDENT_AMBULATORY_CARE_PROVIDER_SITE_OTHER): Payer: 59 | Admitting: Obstetrics and Gynecology

## 2019-09-30 VITALS — BP 118/80 | Ht 66.0 in | Wt 245.0 lb

## 2019-09-30 DIAGNOSIS — R87612 Low grade squamous intraepithelial lesion on cytologic smear of cervix (LGSIL): Secondary | ICD-10-CM

## 2019-09-30 DIAGNOSIS — Z1151 Encounter for screening for human papillomavirus (HPV): Secondary | ICD-10-CM

## 2019-09-30 DIAGNOSIS — Z3046 Encounter for surveillance of implantable subdermal contraceptive: Secondary | ICD-10-CM

## 2019-09-30 DIAGNOSIS — Z3049 Encounter for surveillance of other contraceptives: Secondary | ICD-10-CM | POA: Diagnosis not present

## 2019-09-30 DIAGNOSIS — Z124 Encounter for screening for malignant neoplasm of cervix: Secondary | ICD-10-CM

## 2019-09-30 DIAGNOSIS — Z01419 Encounter for gynecological examination (general) (routine) without abnormal findings: Secondary | ICD-10-CM

## 2019-09-30 MED ORDER — ETONOGESTREL 68 MG ~~LOC~~ IMPL: 1.0000 | DRUG_IMPLANT | Freq: Once | SUBCUTANEOUS | 0 refills | Status: DC

## 2019-09-30 NOTE — Patient Instructions (Signed)
I value your feedback and entrusting [place] with your care. If you get a [facility] patient survey, I would appreciate you taking the time to let us know about your experience today. Thank you!  Remove the dressing in 24 hours,  keep the incision area dry for 24 hours and remove the Steristrip in 2-3  days.  Notify us if any signs of tenderness, redness, pain, or fevers develop.

## 2019-10-01 LAB — CYTOLOGY - PAP
Comment: NEGATIVE
Diagnosis: NEGATIVE
High risk HPV: NEGATIVE

## 2019-10-06 ENCOUNTER — Telehealth: Payer: Self-pay | Admitting: Obstetrics and Gynecology

## 2019-10-06 NOTE — Telephone Encounter (Signed)
Tried to contact pt with neg pap results. Phone number no longer in service.

## 2020-04-19 ENCOUNTER — Encounter: Payer: Self-pay | Admitting: Obstetrics and Gynecology

## 2020-04-20 ENCOUNTER — Other Ambulatory Visit: Payer: Self-pay | Admitting: Obstetrics and Gynecology

## 2020-04-20 MED ORDER — VALACYCLOVIR HCL 500 MG PO TABS: 500.0000 mg | ORAL_TABLET | Freq: Two times a day (BID) | ORAL | 0 refills | Status: DC

## 2020-04-20 NOTE — Progress Notes (Signed)
Rx RF valtrex.  

## 2020-04-29 ENCOUNTER — Encounter: Payer: Self-pay | Admitting: Obstetrics and Gynecology

## 2020-04-30 ENCOUNTER — Ambulatory Visit (INDEPENDENT_AMBULATORY_CARE_PROVIDER_SITE_OTHER): Payer: 59 | Admitting: Obstetrics and Gynecology

## 2020-04-30 ENCOUNTER — Other Ambulatory Visit: Payer: Self-pay

## 2020-04-30 ENCOUNTER — Encounter: Payer: Self-pay | Admitting: Obstetrics and Gynecology

## 2020-04-30 VITALS — BP 120/76 | Ht 66.0 in | Wt 242.0 lb

## 2020-04-30 DIAGNOSIS — R399 Unspecified symptoms and signs involving the genitourinary system: Secondary | ICD-10-CM | POA: Diagnosis not present

## 2020-04-30 LAB — POCT URINALYSIS DIPSTICK
Bilirubin, UA: NEGATIVE
Glucose, UA: NEGATIVE
Ketones, UA: NEGATIVE
Nitrite, UA: NEGATIVE
Protein, UA: NEGATIVE
Spec Grav, UA: 1.01 (ref 1.010–1.025)
pH, UA: 6 (ref 5.0–8.0)

## 2020-04-30 MED ORDER — NITROFURANTOIN MONOHYD MACRO 100 MG PO CAPS: 100.0000 mg | ORAL_CAPSULE | Freq: Two times a day (BID) | ORAL | 0 refills | Status: AC

## 2020-04-30 NOTE — Patient Instructions (Signed)
I value your feedback and entrusting us with your care. If you get a [facility] patient survey, I would appreciate you taking the time to let us know about your experience today. Thank you!  As of October 02, 2019, your lab results will be released to your MyChart immediately, before I even have a chance to see them. Please give me time to review them and contact you if there are any abnormalities. Thank you for your patience.  

## 2020-04-30 NOTE — Progress Notes (Signed)
Mcmahon, Monique Muff, PA   Chief Complaint  Patient presents with   Urinary Tract Infection    frequency and burning when urinating, blood when wipes, back pain x 1 week    HPI:      [name] is a 34 y.o. [id number] whose LMP was No LMP recorded. Patient has had an implant., presents today for urinary frequency, urgency, dysuria, cloudy urine for past wk with hematuria this AM. No LBP, pelvic pain, fevers. No meds to treat. No recent UTIs. No vag sx, no vag bleeding. Drinking a lot of water.   Past Medical History:  Diagnosis Date   Generalized anxiety disorder    Herpes genitalis    HSV 2 pos by IgG   Major depression    Pap smear abnormality of cervix with LGSIL 2017   Pap smear abnormality of vagina with ASC-US 2016    Past Surgical History:  Procedure Laterality Date   COLPOSCOPY  01/04/2006    Family History  Problem Relation Age of Onset   Anemia Mother    Hypertension Father    Ulcerative colitis Sister    Parkinson's disease Maternal Grandmother    Prostate cancer Maternal Grandfather     Social History   Socioeconomic History   Marital status: Single    Spouse name: Not on file   Number of children: Not on file   Years of education: Not on file   Highest education level: Not on file  Occupational History   Not on file  Tobacco Use   Smoking status: Current Every Day Smoker    Packs/day: 0.50    Types: Cigarettes   Smokeless tobacco: Never Used  Vaping Use   Vaping Use: Never used  Substance and Sexual Activity   Alcohol use: Yes    Alcohol/week: 0.0 standard drinks   Drug use: Never   Sexual activity: Yes    Birth control/protection: Implant  Other Topics Concern   Not on file  Social History Narrative   Not on file   Social Determinants of Health   Financial Resource Strain:    Difficulty of Paying Living Expenses:   Food Insecurity:    Worried About Charity fundraiser in the Last Year:    Arts development officer in the Last Year:   Transportation Needs:    Film/video editor (Medical):    Lack of Transportation (Non-Medical):   Physical Activity:    Days of Exercise per Week:    Minutes of Exercise per Session:   Stress:    Feeling of Stress :   Social Connections:    Frequency of Communication with Friends and Family:    Frequency of Social Gatherings with Friends and Family:    Attends Religious Services:    Active Member of Clubs or Organizations:    Attends Music therapist:    Marital Status:   Intimate Partner Violence:    Fear of Current or Ex-Partner:    Emotionally Abused:    Physically Abused:    Sexually Abused:     Outpatient Medications Prior to Visit  Medication Sig Dispense Refill   etonogestrel (NEXPLANON) 68 MG IMPL implant 1 each (68 mg total) by Subdermal route once for 1 dose. 1 each 0   No facility-administered medications prior to visit.      ROS:  Review of Systems  Constitutional: Negative for fever.  Gastrointestinal: Negative for blood in stool, constipation, diarrhea, nausea and vomiting.  Genitourinary: Positive for dysuria, frequency, hematuria and urgency. Negative for dyspareunia, flank pain, vaginal bleeding, vaginal discharge and vaginal pain.  Musculoskeletal: Negative for back pain.  Skin: Negative for rash.    OBJECTIVE:   Vitals:  BP 120/76    Ht 5\' 6"  (1.676 m)    Wt 242 lb (109.8 kg)    BMI 39.06 kg/m   Physical Exam Vitals reviewed.  Constitutional:      Appearance: She is well-developed. She is not ill-appearing or toxic-appearing.  Pulmonary:     Effort: Pulmonary effort is normal.  Abdominal:     Tenderness: There is no right CVA tenderness or left CVA tenderness.  Musculoskeletal:        General: Normal range of motion.     Cervical back: Normal range of motion.  Neurological:     General: No focal deficit present.     Mental Status: She is alert and oriented to person, place,  and time.     Cranial Nerves: No cranial nerve deficit.  Psychiatric:        Behavior: Behavior normal.        Thought Content: Thought content normal.        Judgment: Judgment normal.     Results: Results for orders placed or performed in visit on 04/30/20 (from the past 24 hour(s))  POCT Urinalysis Dipstick     Status: Abnormal   Collection Time: 04/30/20 10:12 AM  Result Value Ref Range   Color, UA yellow    Clarity, UA clear    Glucose, UA Negative Negative   Bilirubin, UA neg    Ketones, UA neg    Spec Grav, UA 1.010 1.010 - 1.025   Blood, UA mod    pH, UA 6.0 5.0 - 8.0   Protein, UA Negative Negative   Urobilinogen, UA     Nitrite, UA neg    Leukocytes, UA Moderate (2+) (A) Negative   Appearance     Odor       Assessment/Plan: UTI symptoms - Plan: POCT Urinalysis Dipstick, nitrofurantoin, macrocrystal-monohydrate, (MACROBID) 100 MG capsule; Pos sx and UA. Rx macrobid. Check C&S. F/u prn.    Meds ordered this encounter  Medications   nitrofurantoin, macrocrystal-monohydrate, (MACROBID) 100 MG capsule    Sig: Take 1 capsule (100 mg total) by mouth 2 (two) times daily for 5 days.    Dispense:  10 capsule    Refill:  0    Order Specific Question:   Supervising Provider    Answer:   Gae Dry [[id number]]      Return if symptoms worsen or fail to improve.  Lunell Robart B. Sanskriti Greenlaw, PA-C 04/30/2020 10:13 AM

## 2020-05-11 ENCOUNTER — Other Ambulatory Visit: Payer: Self-pay

## 2020-05-11 ENCOUNTER — Ambulatory Visit (INDEPENDENT_AMBULATORY_CARE_PROVIDER_SITE_OTHER): Payer: 59 | Admitting: Obstetrics and Gynecology

## 2020-05-11 ENCOUNTER — Encounter: Payer: Self-pay | Admitting: Obstetrics and Gynecology

## 2020-05-11 VITALS — BP 130/90 | Ht 66.0 in | Wt 246.0 lb

## 2020-05-11 DIAGNOSIS — R6882 Decreased libido: Secondary | ICD-10-CM

## 2020-05-11 DIAGNOSIS — R399 Unspecified symptoms and signs involving the genitourinary system: Secondary | ICD-10-CM | POA: Diagnosis not present

## 2020-05-11 LAB — POCT URINALYSIS DIPSTICK
Bilirubin, UA: NEGATIVE
Blood, UA: NEGATIVE
Glucose, UA: NEGATIVE
Ketones, UA: NEGATIVE
Leukocytes, UA: NEGATIVE
Nitrite, UA: NEGATIVE
Protein, UA: NEGATIVE
Spec Grav, UA: 1.01 (ref 1.010–1.025)
pH, UA: 5 (ref 5.0–8.0)

## 2020-05-11 MED ORDER — CIPROFLOXACIN HCL 500 MG PO TABS: 500.0000 mg | ORAL_TABLET | Freq: Two times a day (BID) | ORAL | 0 refills | Status: AC

## 2020-05-11 NOTE — Patient Instructions (Signed)
I value your feedback and entrusting us with your care. If you get a [facility] patient survey, I would appreciate you taking the time to let us know about your experience today. Thank you!  As of October 02, 2019, your lab results will be released to your MyChart immediately, before I even have a chance to see them. Please give me time to review them and contact you if there are any abnormalities. Thank you for your patience.  

## 2020-05-11 NOTE — Progress Notes (Signed)
Chrismon, Vickki Muff, PA   Chief Complaint  Patient presents with  . Urinary Tract Infection    frequency and painful urination, pelvic and back pain, recent abx did not work    HPI:      [name] is a 34 y.o. G0P0000 whose LMP was No LMP recorded. Patient has had an implant., presents today for UTI sx again for past 5-6 days. Pt with urinary frequency with good flow, dysuria, constant, dull LBP and hip pain, and occas sharp pelvic pains. No fevers, hematuria, no vag sx, no vag bleeding. Had UTI 04/30/20, treated with macrobid for 5 days with temporary improvement, but sx recurred. C&S supposed to be done but not ordered. Has had loose stools recently but not sure if pain improved after BM.  Hasn't been recently sex active due to decreased libido. Sx probably started shortly after nexplanon placement. Pt has fear about IUD.   Past Medical History:  Diagnosis Date  . Generalized anxiety disorder   . Herpes genitalis    HSV 2 pos by IgG  . Major depression   . Pap smear abnormality of cervix with LGSIL 2017  . Pap smear abnormality of vagina with ASC-US 2016    Past Surgical History:  Procedure Laterality Date  . COLPOSCOPY  01/04/2006    Family History  Problem Relation Age of Onset  . Anemia Mother   . Hypertension Father   . Ulcerative colitis Sister   . Parkinson's disease Maternal Grandmother   . Prostate cancer Maternal Grandfather     Social History   Socioeconomic History  . Marital status: Single    Spouse name: Not on file  . Number of children: Not on file  . Years of education: Not on file  . Highest education level: Not on file  Occupational History  . Not on file  Tobacco Use  . Smoking status: Current Every Day Smoker    Packs/day: 0.50    Types: Cigarettes  . Smokeless tobacco: Never Used  Vaping Use  . Vaping Use: Never used  Substance and Sexual Activity  . Alcohol use: Yes    Alcohol/week: 0.0 standard drinks  . Drug use: Never    . Sexual activity: Yes    Birth control/protection: Implant  Other Topics Concern  . Not on file  Social History Narrative  . Not on file   Social Determinants of Health   Financial Resource Strain:   . Difficulty of Paying Living Expenses:   Food Insecurity:   . Worried About Charity fundraiser in the Last Year:   . Arboriculturist in the Last Year:   Transportation Needs:   . Film/video editor (Medical):   Marland Kitchen Lack of Transportation (Non-Medical):   Physical Activity:   . Days of Exercise per Week:   . Minutes of Exercise per Session:   Stress:   . Feeling of Stress :   Social Connections:   . Frequency of Communication with Friends and Family:   . Frequency of Social Gatherings with Friends and Family:   . Attends Religious Services:   . Active Member of Clubs or Organizations:   . Attends Archivist Meetings:   Marland Kitchen Marital Status:   Intimate Partner Violence:   . Fear of Current or Ex-Partner:   . Emotionally Abused:   Marland Kitchen Physically Abused:   . Sexually Abused:     Outpatient Medications Prior to Visit  Medication Sig Dispense Refill  .  etonogestrel (NEXPLANON) 68 MG IMPL implant 1 each (68 mg total) by Subdermal route once for 1 dose. 1 each 0   No facility-administered medications prior to visit.      ROS:  Review of Systems  Constitutional: Negative for fever.  Gastrointestinal: Negative for blood in stool, constipation, diarrhea, nausea and vomiting.  Genitourinary: Positive for dysuria, frequency and pelvic pain. Negative for dyspareunia, flank pain, hematuria, urgency, vaginal bleeding, vaginal discharge and vaginal pain.  Musculoskeletal: Positive for back pain.  Skin: Negative for rash.    OBJECTIVE:   Vitals:  BP 130/90   Ht 5\' 6"  (1.676 m)   Wt 246 lb (111.6 kg)   BMI 39.71 kg/m   Physical Exam Vitals reviewed.  Constitutional:      Appearance: She is well-developed. She is not ill-appearing or toxic-appearing.  Pulmonary:      Effort: Pulmonary effort is normal.  Abdominal:     Tenderness: There is abdominal tenderness in the suprapubic area. There is no right CVA tenderness, left CVA tenderness, guarding or rebound.  Musculoskeletal:        General: Normal range of motion.     Cervical back: Normal range of motion.  Neurological:     General: No focal deficit present.     Mental Status: She is alert and oriented to person, place, and time.     Cranial Nerves: No cranial nerve deficit.  Psychiatric:        Behavior: Behavior normal.        Thought Content: Thought content normal.        Judgment: Judgment normal.     Results: Results for orders placed or performed in visit on 05/11/20 (from the past 24 hour(s))  POCT Urinalysis Dipstick     Status: Normal   Collection Time: 05/11/20 11:46 AM  Result Value Ref Range   Color, UA yellow    Clarity, UA clear    Glucose, UA Negative Negative   Bilirubin, UA neg    Ketones, UA neg    Spec Grav, UA 1.010 1.010 - 1.025   Blood, UA neg    pH, UA 5.0 5.0 - 8.0   Protein, UA Negative Negative   Urobilinogen, UA     Nitrite, UA neg    Leukocytes, UA Negative Negative   Appearance     Odor       Assessment/Plan: UTI symptoms - Plan: POCT Urinalysis Dipstick, Urine Culture, ciprofloxacin (CIPRO) 500 MG tablet; Pos sx, neg UA. Rx cipro given sx. Check C&S. Will f/u with results. If C&S neg and sx persist, pt needs further eval.   Decreased libido--discussed possibility of side effect of nexplanon. Could remove and try IUD. Pt to consider.    Meds ordered this encounter  Medications  . ciprofloxacin (CIPRO) 500 MG tablet    Sig: Take 1 tablet (500 mg total) by mouth 2 (two) times daily for 3 days.    Dispense:  6 tablet    Refill:  0    Order Specific Question:   Supervising Provider    Answer:   Gae Dry [[id number]]      Return if symptoms worsen or fail to improve.  Ganon Demasi B. Jakarri Lesko, PA-C 05/11/2020 11:48 AM

## 2020-05-13 ENCOUNTER — Encounter: Payer: Self-pay | Admitting: Obstetrics and Gynecology

## 2020-05-13 LAB — URINE CULTURE

## 2020-10-07 ENCOUNTER — Encounter: Payer: Self-pay | Admitting: Obstetrics and Gynecology

## 2020-11-17 ENCOUNTER — Encounter: Payer: Self-pay | Admitting: Obstetrics and Gynecology

## 2020-11-17 ENCOUNTER — Ambulatory Visit (INDEPENDENT_AMBULATORY_CARE_PROVIDER_SITE_OTHER): Payer: 59 | Admitting: Obstetrics and Gynecology

## 2020-11-17 ENCOUNTER — Other Ambulatory Visit (HOSPITAL_COMMUNITY)
Admission: RE | Admit: 2020-11-17 | Discharge: 2020-11-17 | Disposition: A | Discharge status: Home / Self Care | Payer: 59 | Source: Ambulatory Visit | Attending: Obstetrics and Gynecology | Admitting: Obstetrics and Gynecology

## 2020-11-17 ENCOUNTER — Other Ambulatory Visit: Payer: Self-pay

## 2020-11-17 VITALS — BP 142/90 | Ht 66.0 in | Wt 253.0 lb

## 2020-11-17 DIAGNOSIS — Z124 Encounter for screening for malignant neoplasm of cervix: Secondary | ICD-10-CM

## 2020-11-17 DIAGNOSIS — Z1151 Encounter for screening for human papillomavirus (HPV): Secondary | ICD-10-CM | POA: Diagnosis present

## 2020-11-17 DIAGNOSIS — Z01419 Encounter for gynecological examination (general) (routine) without abnormal findings: Secondary | ICD-10-CM | POA: Diagnosis not present

## 2020-11-17 DIAGNOSIS — R87612 Low grade squamous intraepithelial lesion on cytologic smear of cervix (LGSIL): Secondary | ICD-10-CM | POA: Diagnosis not present

## 2020-11-17 DIAGNOSIS — Z3046 Encounter for surveillance of implantable subdermal contraceptive: Secondary | ICD-10-CM

## 2020-11-17 NOTE — Progress Notes (Signed)
PCP:  Margo Common, PA-C   Chief Complaint  Patient presents with  . Gynecologic Exam    Possibly remove nexplanon, looking for something non hormonal, not interested in IUD     HPI:      [name] is a 35 y.o. No obstetric history on file. who LMP was No LMP recorded. Patient has had an implant., presents today for her annual examination.  Her menses are absent with nexplanon. No BTB, no dysmen.   Sex activity: single partner, contraception - Nexplanon REplaced 09/30/19. Pt has decreased libido and wonders if due to nexplanon. Also smokes marijuana daily which could be contributory. Partner is considering vasectomy instead of pt having TL. Will f/u after he is clear for nexplanon removal.   Last Pap: 09/30/19 Results were normal, neg HPV DNA.  09/2016 Results were: low-grade squamous intraepithelial neoplasia (LGSIL - encompassing HPV,mild dysplasia,CIN I) /neg HPV DNA. Neg colpo bx with Dr. Georgianne Fick 1/18.  Also with LGSIL 2016 and neg colpo bx 2017. Repeat pap due today. 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 There is no FH of ovarian cancer. The patient does do self-breast exams.  Tobacco use: smokes 1 ppd, not ready to quit Alcohol use: few days weekly Daily marijuana use.  Exercise: min active  She does get adequate calcium but not Vitamin D in her diet.  Has some anxiety/depression sx that she feels are improving. Doesn't think she needs meds.   Patient Active Problem List   Diagnosis Date Noted  . LGSIL on Pap smear of cervix 11/17/2020  . Acute anxiety 01/21/2016  . Depression 01/21/2016    Past Surgical History:  Procedure Laterality Date  . COLPOSCOPY  01/04/2006    Family History  Problem Relation Age of Onset  . Anemia Mother   . Hypertension Father   . Ulcerative colitis Sister   . Parkinson's disease Maternal Grandmother   . Prostate cancer Maternal Grandfather     Social History   Socioeconomic History  .  Marital status: Single    Spouse name: Not on file  . Number of children: Not on file  . Years of education: Not on file  . Highest education level: Not on file  Occupational History  . Not on file  Tobacco Use  . Smoking status: Current Every Day Smoker    Packs/day: 0.50    Types: Cigarettes  . Smokeless tobacco: Never Used  Vaping Use  . Vaping Use: Never used  Substance and Sexual Activity  . Alcohol use: Yes    Alcohol/week: 0.0 standard drinks  . Drug use: Never  . Sexual activity: Yes    Birth control/protection: Implant  Other Topics Concern  . Not on file  Social History Narrative  . Not on file   Social Determinants of Health   Financial Resource Strain: Not on file  Food Insecurity: Not on file  Transportation Needs: Not on file  Physical Activity: Not on file  Stress: Not on file  Social Connections: Not on file  Intimate Partner Violence: Not on file     Current Outpatient Medications:  .  etonogestrel (NEXPLANON) 68 MG IMPL implant, 1 each (68 mg total) by Subdermal route once for 1 dose., Disp: 1 each, Rfl: 0   ROS:  Review of Systems  Constitutional: Negative for fatigue, fever and unexpected weight change.  Respiratory: Negative for cough, shortness of breath and wheezing.   Cardiovascular: Negative for  chest pain, palpitations and leg swelling.  Gastrointestinal: Negative for blood in stool, constipation, diarrhea, nausea and vomiting.  Endocrine: Negative for cold intolerance, heat intolerance and polyuria.  Genitourinary: Negative for dyspareunia, dysuria, flank pain, frequency, genital sores, hematuria, menstrual problem, pelvic pain, urgency, vaginal bleeding, vaginal discharge and vaginal pain.  Musculoskeletal: Negative for back pain, joint swelling and myalgias.  Skin: Negative for rash.  Neurological: Negative for dizziness, syncope, light-headedness, numbness and headaches.  Hematological: Negative for adenopathy.   Psychiatric/Behavioral: Positive for agitation and dysphoric mood. Negative for confusion, sleep disturbance and suicidal ideas. The patient is not nervous/anxious.   BREAST: No symptoms   Objective: BP (!) 142/90   Ht 5\' 6"  (1.676 m)   Wt 253 lb (114.8 kg)   BMI 40.84 kg/m    Physical Exam Constitutional:      Appearance: She is well-developed.  Genitourinary:     Vulva normal.     Right Labia: No rash, tenderness or lesions.    Left Labia: No tenderness, lesions or rash.    No vaginal discharge, erythema or tenderness.      Right Adnexa: not tender and no mass present.    Left Adnexa: not tender and no mass present.    No cervical friability or polyp.     Uterus is not enlarged or tender.  Breasts:     Right: No mass, nipple discharge, skin change or tenderness.     Left: No mass, nipple discharge, skin change or tenderness.    Neck:     Thyroid: No thyromegaly.  Cardiovascular:     Rate and Rhythm: Normal rate and regular rhythm.     Heart sounds: Normal heart sounds. No murmur heard.   Pulmonary:     Effort: Pulmonary effort is normal.     Breath sounds: Normal breath sounds.  Abdominal:     Palpations: Abdomen is soft.     Tenderness: There is no abdominal tenderness. There is no guarding or rebound.  Musculoskeletal:        General: Normal range of motion.     Cervical back: Normal range of motion.  Lymphadenopathy:     Cervical: No cervical adenopathy.  Neurological:     General: No focal deficit present.     Mental Status: She is alert and oriented to person, place, and time.     Cranial Nerves: No cranial nerve deficit.  Skin:    General: Skin is warm and dry.  Psychiatric:        Mood and Affect: Mood normal.        Behavior: Behavior normal.        Thought Content: Thought content normal.        Judgment: Judgment normal.  Vitals reviewed.    Assessment/Plan: Encounter for annual routine gynecological examination  Cervical cancer screening  - Plan: Cytology - PAP  Screening for HPV (human papillomavirus) - Plan: Cytology - PAP  LGSIL on Pap smear of cervix - Plan: Cytology - PAP; repeat pap today. Will f/u if abn.   Encounter for surveillance of implantable subdermal contraceptive--good till 12/23, but may RTO sooner for removal.    GYN counsel adequate intake of calcium and vitamin D, diet and exercise; tobacco and marijuana cessation     F/U  Return in about 1 year (around 11/17/2021).  Derik Fults B. Angelicia Lessner, PA-C 11/17/2020 2:33 PM

## 2020-11-17 NOTE — Patient Instructions (Signed)
I value your feedback and you entrusting us with your care. If you get a [facility] patient survey, I would appreciate you taking the time to let us know about your experience today. Thank you! ? ? ?

## 2020-11-22 LAB — CYTOLOGY - PAP
Comment: NEGATIVE
High risk HPV: NEGATIVE

## 2021-01-26 ENCOUNTER — Encounter: Payer: Self-pay | Admitting: Family Medicine

## 2021-01-26 ENCOUNTER — Telehealth (INDEPENDENT_AMBULATORY_CARE_PROVIDER_SITE_OTHER): Payer: 59 | Admitting: Family Medicine

## 2021-01-26 DIAGNOSIS — R21 Rash and other nonspecific skin eruption: Secondary | ICD-10-CM | POA: Diagnosis not present

## 2021-01-26 MED ORDER — PREDNISONE 10 MG (21) PO TBPK: ORAL_TABLET | ORAL | 0 refills | Status: DC

## 2021-01-26 NOTE — Patient Instructions (Signed)
Rash, Adult  A rash is a change in the color of your skin. A rash can also change the way your skin feels. There are many different conditions and factors that can cause a rash. Follow these instructions at home: The goal of treatment is to stop the itching and keep the rash from spreading. Watch for any changes in your symptoms. Let your doctor know about them. Follow these instructions to help with your condition: Medicine Take or apply over-the-counter and prescription medicines only as told by your doctor. These may include medicines:  To treat red or swollen skin (corticosteroid creams).  To treat itching.  To treat an allergy (oral antihistamines).  To treat very bad symptoms (oral corticosteroids).   Skin care  Put cool cloths (compresses) on the affected areas.  Do not scratch or rub your skin.  Avoid covering the rash. Make sure that the rash is exposed to air as much as possible. Managing itching and discomfort  Avoid hot showers or baths. These can make itching worse. A cold shower may help.  Try taking a bath with: ? Epsom salts. You can get these at your local pharmacy or grocery store. Follow the instructions on the package. ? Baking soda. Pour a small amount into the bath as told by your doctor. ? Colloidal oatmeal. You can get this at your local pharmacy or grocery store. Follow the instructions on the package.  Try putting baking soda paste onto your skin. Stir water into baking soda until it gets like a paste.  Try putting on a lotion that relieves itchiness (calamine lotion).  Keep cool and out of the sun. Sweating and being hot can make itching worse. General instructions  Rest as needed.  Drink enough fluid to keep your pee (urine) pale yellow.  Wear loose-fitting clothing.  Avoid scented soaps, detergents, and perfumes. Use gentle soaps, detergents, perfumes, and other cosmetic products.  Avoid anything that causes your rash. Keep a journal to help  track what causes your rash. Write down: ? What you eat. ? What cosmetic products you use. ? What you drink. ? What you wear. This includes jewelry.  Keep all follow-up visits as told by your doctor. This is important.   Contact a doctor if:  You sweat at night.  You lose weight.  You pee (urinate) more than normal.  You pee less than normal, or you notice that your pee is a darker color than normal.  You feel weak.  You throw up (vomit).  Your skin or the whites of your eyes look yellow (jaundice).  Your skin: ? Tingles. ? Is numb.  Your rash: ? Does not go away after a few days. ? Gets worse.  You are: ? More thirsty than normal. ? More tired than normal.  You have: ? New symptoms. ? Pain in your belly (abdomen). ? A fever. ? Watery poop (diarrhea). Get help right away if:  You have a fever and your symptoms suddenly get worse.  You start to feel mixed up (confused).  You have a very bad headache or a stiff neck.  You have very bad joint pains or stiffness.  You have jerky movements that you cannot control (seizure).  Your rash covers all or most of your body. The rash may or may not be painful.  You have blisters that: ? Are on top of the rash. ? Grow larger. ? Grow together. ? Are painful. ? Are inside your nose or mouth.  You have   a rash that: ? Looks like purple pinprick-sized spots all over your body. ? Has a "bull's eye" or looks like a target. ? Is red and painful, causes your skin to peel, and is not from being in the sun too long. Summary  A rash is a change in the color of your skin. A rash can also change the way your skin feels.  The goal of treatment is to stop the itching and keep the rash from spreading.  Take or apply over-the-counter and prescription medicines only as told by your doctor.  Contact a doctor if you have new symptoms or symptoms that get worse.  Keep all follow-up visits as told by your doctor. This is  important. This information is not intended to replace advice given to you by your health care provider. Make sure you discuss any questions you have with your health care provider. Document Revised: 01/31/2019 Document Reviewed: 05/13/2018 Elsevier Patient Education  2021 Elsevier Inc.  

## 2021-01-26 NOTE — Progress Notes (Signed)
Virtual Telephone Visit    Virtual Visit via Video Note   This visit type was conducted due to national recommendations for restrictions regarding the COVID-19 Pandemic (e.g. social distancing) in an effort to limit this patient's exposure and mitigate transmission in our community. This patient is at least at moderate risk for complications without adequate follow up. This format is felt to be most appropriate for this patient at this time. Physical exam was limited by quality of the video and audio technology used for the visit.   Patient location: Home with no family members present Provider location: Home office  I discussed the limitations of evaluation and management by telemedicine and the availability of in person appointments. The patient expressed understanding and agreed to proceed.  Patient: Monique Mcmahon   DOB: 01/22/1986   35 y.o. Female  MRN: [id number] Visit Date: 01/26/2021  Today's healthcare provider: Laurita Quint Just, FNP   Chief Complaint  Patient presents with  . Rash   Subjective    Rash This is a new problem. The current episode started 1 to 4 weeks ago. The problem is unchanged. The affected locations include the left foot, right foot, left lower leg and right lower leg. The rash is characterized by itchiness, dryness, pain and redness (warm to touch). She was exposed to nothing. Associated symptoms include fatigue. Pertinent negatives include no anorexia, congestion, cough, diarrhea, eye pain, facial edema, fever, joint pain, nail changes, rhinorrhea, shortness of breath, sore throat or vomiting. Past treatments include antihistamine. The treatment provided no relief. There is no history of eczema.    At first thought it was bug bites But now is thinking it is a rash Started 2 weeks ago Noticed warm, itchy, raised bumps The new ones look like small red dot, but seem to be raised under On both shins and tops of feet Spends a lot of time outside Does  have seasonal allergies Denies congestion type symptoms Every bug bite she gets does tend to get more inflammed Denies anyone else in family have these symptoms Still extremely itchy Has taken antihistamines but are not helping for the itching Has not used any creams for this   Medications: Outpatient Medications Prior to Visit  Medication Sig  . etonogestrel (NEXPLANON) 68 MG IMPL implant 1 each (68 mg total) by Subdermal route once for 1 dose.   No facility-administered medications prior to visit.    Review of Systems  Constitutional: Positive for fatigue. Negative for fever.  HENT: Negative for congestion, rhinorrhea and sore throat.   Eyes: Negative for pain.  Respiratory: Negative for cough, shortness of breath and wheezing.   Cardiovascular: Negative for leg swelling.  Gastrointestinal: Negative for anorexia, diarrhea and vomiting.  Musculoskeletal: Negative for joint pain.  Skin: Positive for rash. Negative for nail changes.      Objective    Due to Virtual visit no vitals were taken and limited physical exam.  Physical Exam Constitutional:      General: She is not in acute distress. Pulmonary:     Effort: Pulmonary effort is normal.  Neurological:     Mental Status: She is alert and oriented to person, place, and time.  Psychiatric:        Mood and Affect: Mood normal.        Assessment & Plan     Problem List Items Addressed This Visit   None   Visit Diagnoses    Rash and nonspecific skin eruption    -  Primary   Relevant Medications   predniSONE (STERAPRED UNI-PAK 21 TAB) 10 MG (21) TBPK tablet     Plan Prednisone taper ordered, r/se/b discussed Discussed OTC treatments for rash including topical hydrocortisone RTC/ED precautions provided Discussed potential triggers and allergy medications  Return if symptoms worsen or fail to improve.     I discussed the assessment and treatment plan with the patient. The patient was provided an  opportunity to ask questions and all were answered. The patient agreed with the plan and demonstrated an understanding of the instructions.   The patient was advised to call back or seek an in-person evaluation if the symptoms worsen or if the condition fails to improve as anticipated.  I provided 20 minutes of non-face-to-face time during this encounter.  [place], [place] [phone] (phone) [phone] (fax)  [place]

## 2021-01-27 ENCOUNTER — Ambulatory Visit: Payer: 59 | Admitting: Family Medicine

## 2021-02-02 ENCOUNTER — Encounter: Payer: Self-pay | Admitting: Family Medicine

## 2021-03-04 ENCOUNTER — Telehealth: Payer: Self-pay

## 2021-03-04 ENCOUNTER — Other Ambulatory Visit: Payer: Self-pay | Admitting: Family Medicine

## 2021-03-04 DIAGNOSIS — R21 Rash and other nonspecific skin eruption: Secondary | ICD-10-CM

## 2021-03-04 NOTE — Telephone Encounter (Signed)
Copied from [place] [id number]. Topic: General - Other >> Mar 04, 2021  8:37 AM Leward Quan A wrote: Reason for CRM: Patient called in to inform Simona Huh Chrismon that she have broken out in hives like she did a month ago but this time its worst. Say that she was prescribed Prednisone last time that did not work need to see a provider or have something called in to her pharmacy please call Ph# [phone]

## 2021-03-08 NOTE — Telephone Encounter (Signed)
Telephone conversation: having hives recurrences without relief from steroids/prednisone or antihistamines. Recommend trying H2 blocker with H1 blocker BID daily and recheck in the office next week.

## 2021-03-18 ENCOUNTER — Encounter: Payer: Self-pay | Admitting: Family Medicine

## 2021-03-18 ENCOUNTER — Ambulatory Visit (INDEPENDENT_AMBULATORY_CARE_PROVIDER_SITE_OTHER): Payer: 59 | Admitting: Family Medicine

## 2021-03-18 ENCOUNTER — Other Ambulatory Visit: Payer: Self-pay

## 2021-03-18 VITALS — BP 131/80 | HR 87 | Wt 262.0 lb

## 2021-03-18 DIAGNOSIS — L299 Pruritus, unspecified: Secondary | ICD-10-CM

## 2021-03-18 MED ORDER — VENLAFAXINE HCL ER 75 MG PO TB24: 75.0000 mg | ORAL_TABLET | Freq: Every day | ORAL | 1 refills | Status: DC

## 2021-03-18 NOTE — Progress Notes (Signed)
Established patient visit   Patient: Monique Mcmahon   DOB: Oct 31, 1985   35 y.o. Female  MRN: [id number] Visit Date: 03/18/2021  Today's healthcare provider: Vernie Murders, PA-C   Chief Complaint  Patient presents with  . Rash   Subjective    Rash This is a recurrent problem. The current episode started more than 1 month ago (Started 10/2020). The problem has been waxing and waning since onset. The rash is diffuse. The rash is characterized by itchiness and redness. She was exposed to nothing. Pertinent negatives include no congestion, cough, diarrhea, eye pain, facial edema, fatigue, nail changes or shortness of breath.      Patient Active Problem List   Diagnosis Date Noted  . LGSIL on Pap smear of cervix 11/17/2020  . Acute anxiety 01/21/2016  . Depression 01/21/2016   Past Medical History:  Diagnosis Date  . Generalized anxiety disorder   . Herpes genitalis    HSV 2 pos by IgG  . Major depression   . Pap smear abnormality of cervix with LGSIL 2017  . Pap smear abnormality of vagina with ASC-US 2016   Past Surgical History:  Procedure Laterality Date  . COLPOSCOPY  01/04/2006   Family History  Problem Relation Age of Onset  . Anemia Mother   . Hypertension Father   . Ulcerative colitis Sister   . Parkinson's disease Maternal Grandmother   . Prostate cancer Maternal Grandfather     Social History   Tobacco Use  . Smoking status: Current Every Day Smoker    Packs/day: 0.50    Types: Cigarettes  . Smokeless tobacco: Never Used  Vaping Use  . Vaping Use: Never used  Substance Use Topics  . Alcohol use: Yes    Alcohol/week: 0.0 standard drinks  . Drug use: Never   No Known Allergies   Medications: Outpatient Medications Prior to Visit  Medication Sig  . etonogestrel (NEXPLANON) 68 MG IMPL implant 1 each (68 mg total) by Subdermal route once for 1 dose.  . predniSONE (STERAPRED UNI-PAK 21 TAB) 10 MG (21) TBPK tablet Take as directed on the  package   No facility-administered medications prior to visit.    Review of Systems  Constitutional: Negative for fatigue.  HENT: Negative.  Negative for congestion.   Eyes: Negative for pain.  Respiratory: Negative.  Negative for cough and shortness of breath.   Cardiovascular: Negative.   Gastrointestinal: Negative for diarrhea.  Skin: Positive for rash. Negative for nail changes.  Neurological: Negative for dizziness, light-headedness and headaches.     Objective    BP 131/80 (BP Location: Right Arm, Patient Position: Sitting, Cuff Size: Large)   Pulse 87   Wt 262 lb (118.8 kg)   SpO2 99%   BMI 42.29 kg/m     Physical Exam Constitutional:      General: She is not in acute distress.    Appearance: She is well-developed.  HENT:     Head: Normocephalic and atraumatic.     Right Ear: Hearing normal.     Left Ear: Hearing normal.     Nose: Nose normal.  Eyes:     General: Lids are normal. No scleral icterus.       Right eye: No discharge.        Left eye: No discharge.     Conjunctiva/sclera: Conjunctivae normal.  Pulmonary:     Effort: Pulmonary effort is normal. No respiratory distress.  Musculoskeletal:  General: Normal range of motion.  Skin:    Findings: Rash present. No lesion.     Comments: Pruritic whelps with some scabs on top from scratching. Some lesions have bloody blister appearance.  Neurological:     Mental Status: She is alert and oriented to person, place, and time.  Psychiatric:        Speech: Speech normal.        Behavior: Behavior normal.        Thought Content: Thought content normal.       No results found for any visits on 03/18/21.  Assessment & Plan     1. Pruritic dermatitis Onset of a very itchy rash on arms, hands, feet, lower legs and occasionally on her back since January 2022. No relief from benadryl, vistaril, prednisone, cortisone shot, triamcinolone cream, etc. Past dermatology evaluation negative for scabies and house  treated for bedbugs (none found). Will try Effexor for stress control and H1 blocker (antihistamine) with H2 blocker (Famotidine). Has an appointment with allergist/dermatologist soon. Check labs for metabolic disorder. - CBC with Differential/Platelet - Comprehensive metabolic panel - TSH - Venlafaxine HCl 75 MG TB24; Take 1 tablet (75 mg total) by mouth daily.  Dispense: 30 tablet; Refill: 1   No follow-ups on file.      I, Yazen Rosko, PA-C, have reviewed all documentation for this visit. The documentation on 03/18/21 for the exam, diagnosis, procedures, and orders are all accurate and complete.    Vernie Murders, PA-C  [employer] [phone] (phone) [phone] (fax)  [place]

## 2021-03-19 LAB — COMPREHENSIVE METABOLIC PANEL
ALT: 37 IU/L — ABNORMAL HIGH (ref 0–32)
AST: 19 IU/L (ref 0–40)
Albumin/Globulin Ratio: 1.8 (ref 1.2–2.2)
Albumin: 4.8 g/dL (ref 3.8–4.8)
Alkaline Phosphatase: 61 IU/L (ref 44–121)
BUN/Creatinine Ratio: 12 (ref 9–23)
BUN: 11 mg/dL (ref 6–20)
Bilirubin Total: 0.3 mg/dL (ref 0.0–1.2)
CO2: 20 mmol/L (ref 20–29)
Calcium: 10.1 mg/dL (ref 8.7–10.2)
Chloride: 100 mmol/L (ref 96–106)
Creatinine, Ser: 0.89 mg/dL (ref 0.57–1.00)
Globulin, Total: 2.6 g/dL (ref 1.5–4.5)
Glucose: 95 mg/dL (ref 65–99)
Potassium: 5.4 mmol/L — ABNORMAL HIGH (ref 3.5–5.2)
Sodium: 139 mmol/L (ref 134–144)
Total Protein: 7.4 g/dL (ref 6.0–8.5)
eGFR: 87 mL/min/{1.73_m2} (ref >59)

## 2021-03-19 LAB — CBC WITH DIFFERENTIAL/PLATELET
Basophils Absolute: 0.1 10*3/uL (ref 0.0–0.2)
Basos: 1 %
EOS (ABSOLUTE): 0.2 10*3/uL (ref 0.0–0.4)
Eos: 2 %
Hematocrit: 44.9 % (ref 34.0–46.6)
Hemoglobin: 15.6 g/dL (ref 11.1–15.9)
Immature Grans (Abs): 0 10*3/uL (ref 0.0–0.1)
Immature Granulocytes: 1 %
Lymphocytes Absolute: 3 10*3/uL (ref 0.7–3.1)
Lymphs: 34 %
MCH: 31.8 pg (ref 26.6–33.0)
MCHC: 34.7 g/dL (ref 31.5–35.7)
MCV: 92 fL (ref 79–97)
Monocytes Absolute: 0.7 10*3/uL (ref 0.1–0.9)
Monocytes: 8 %
Neutrophils Absolute: 4.7 10*3/uL (ref 1.4–7.0)
Neutrophils: 54 %
Platelets: 294 10*3/uL (ref 150–450)
RBC: 4.9 x10E6/uL (ref 3.77–5.28)
RDW: 12.6 % (ref 11.7–15.4)
WBC: 8.7 10*3/uL (ref 3.4–10.8)

## 2021-03-19 LAB — TSH: TSH: 1.53 u[IU]/mL (ref 0.450–4.500)

## 2021-03-22 ENCOUNTER — Ambulatory Visit: Payer: 59 | Admitting: Dermatology

## 2021-03-22 ENCOUNTER — Encounter: Payer: Self-pay | Admitting: Dermatology

## 2021-03-22 ENCOUNTER — Other Ambulatory Visit: Payer: Self-pay

## 2021-03-22 DIAGNOSIS — L299 Pruritus, unspecified: Secondary | ICD-10-CM | POA: Diagnosis not present

## 2021-03-22 DIAGNOSIS — R21 Rash and other nonspecific skin eruption: Secondary | ICD-10-CM

## 2021-03-22 DIAGNOSIS — L308 Other specified dermatitis: Secondary | ICD-10-CM | POA: Diagnosis not present

## 2021-03-22 MED ORDER — CLOBETASOL PROPIONATE 0.05 % EX FOAM: CUTANEOUS | 0 refills | Status: DC

## 2021-03-22 MED ORDER — PERMETHRIN 5 % EX CREA: TOPICAL_CREAM | CUTANEOUS | 1 refills | Status: DC

## 2021-03-22 NOTE — Progress Notes (Signed)
New Patient Visit  Subjective  Monique Mcmahon is a 35 y.o. female who presents for the following:  Rash (That started in January  that moves around to different locations on her body. Rash started on the legs but has since moved to the arms, and is now on the hands, feet, and torso. Patient was Prednisone about a month ago, and had a IM Kenalog injection about 2 months ago, but neither one of those helped with itching or make the lesions resolve. She is currently using Hydroxyzine 25mg  po TID and TMC 0.1% ointment. ). She did see another dermatologist a few weeks ago and even though she tested negative for scabies they prescribed Permethrin cream for her. She has not started treatment yet. Lesions come up and last for about a week and she is getting new ones every couple of days. Patient's fiance doesn't have rash. She did have her home treated for insects a few months ago.   The following portions of the chart were reviewed this encounter and updated as appropriate:   Tobacco  Allergies  Meds  Problems  Med Hx  Surg Hx  Fam Hx     Review of Systems:  No other skin or systemic complaints except as noted in HPI or Assessment and Plan.  Objective  Well appearing patient in no apparent distress; mood and affect are within normal limits.  A focused examination was performed including the arms, legs, and abdomen . Relevant physical exam findings are noted in the Assessment and Plan.  Objective  B/L hands, feet, trunk: Edematous pink red papule L dorsal hand, pink papules clustered at toes, pink papules at abdomen, pink papule with surrounding erythema at L flank. Clustered pink papules at finger web spaces.    Assessment & Plan  Rash and other nonspecific skin eruption B/L hands, feet, trunk  Involvement at left 4th fingerweb and toe suspicious for scabies. Would recommend treating empirically for scabies given the appearance today and persistent rash unresponsive to other  therapies.   Ddx scabies, papular urticaria > drug rash or other  Discussed with patient highly contagious condition so everyone in the house should be treated as well as washing bedding, pillows, etc. In hot water and repeating in one week.   Start Permethrin 5% cream apply from the neck down including between body folds and creases at night and wash off in the morning. Repeat in one week. Fiance should apply neck down just once on the same night the patient does her first treatment.   Start Clobetasol foam apply to aa's QD-BID PRN. Avoid applying to face, groin, and axilla. Use as directed. Risk of skin atrophy with long-term use reviewed.   Topical steroids (such as triamcinolone, fluocinolone, fluocinonide, mometasone, clobetasol, halobetasol, betamethasone, hydrocortisone) can cause thinning and lightening of the skin if they are used for too long in the same area. Your physician has selected the right strength medicine for your problem and area affected on the body. Please use your medication only as directed by your physician to prevent side effects.   Discussed bx at follow up if not improving but patient prefers to have biopsy done today.  Also recommend using picaridin or deet based insect repellant anytime going outdoors  permethrin (ELIMITE) 5 % cream - B/L hands, feet, trunk  clobetasol (OLUX) 0.05 % topical foam - B/L hands, feet, trunk  Skin / nail biopsy - B/L hands, feet, trunk Type of biopsy: punch   Informed consent: discussed and  consent obtained   Timeout: patient name, date of birth, surgical site, and procedure verified   Procedure prep:  Patient was prepped and draped in usual sterile fashion (the patient was cleaned and prepped) Prep type:  Isopropyl alcohol Anesthesia: the lesion was anesthetized in a standard fashion   Anesthetic:  1% lidocaine w/ epinephrine 1-100,000 buffered w/ 8.4% NaHCO3 Punch size:  4 mm Suture size:  4-0 Suture type: Prolene  (polypropylene)   Suture type comment:  4.0 Hemostasis achieved with: suture, pressure and aluminum chloride   Outcome: patient tolerated procedure well   Post-procedure details: sterile dressing applied and wound care instructions given   Dressing type: bandage, petrolatum and pressure dressing    Specimen 1 - Surgical pathology Differential Diagnosis: D48.5, R21 r/o scabies vs other  Check Margins: No Edematous pink red papule L dorsal hand, pink papules clustered at toes, pink papules at abdomen, pink papule with surrounding erythema at L flank. Clustered pink papules at finger web spaces.  Pruritus Left Forearm - Anterior  Start Clobetasol foam apply to aa's QD-BID PRN. Avoid applying to face, groin, and axilla. Use as directed. Risk of skin atrophy with long-term use reviewed.   Topical steroids (such as triamcinolone, fluocinolone, fluocinonide, mometasone, clobetasol, halobetasol, betamethasone, hydrocortisone) can cause thinning and lightening of the skin if they are used for too long in the same area. Your physician has selected the right strength medicine for your problem and area affected on the body. Please use your medication only as directed by your physician to prevent side effects.   Can continue hydroxyzine as needed.  Return in about 2 weeks (around 04/05/2021) for suture removal and to discuss pathology results.  Luther Redo, CMA, am acting as scribe for Forest Gleason, MD .  Documentation: I have reviewed the above documentation for accuracy and completeness, and I agree with the above.  Forest Gleason, MD

## 2021-03-22 NOTE — Patient Instructions (Addendum)
If you have any questions or concerns for your doctor, please call our main line at [phone] and press option 4 to reach your doctor's medical assistant. If no one answers, please leave a voicemail as directed and we will return your call as soon as possible. Messages left after 4 pm will be answered the following business day.   You may also send [place] a message via [place]. We typically respond to MyChart messages within 1-2 business days.  For prescription refills, please ask your pharmacy to contact our office. Our fax number is [phone].  If you have an urgent issue when the clinic is closed that cannot wait until the next business day, you can page your doctor at the number below.    Please note that while we do our best to be available for urgent issues outside of office hours, we are not available 24/7.   If you have an urgent issue and are unable to reach [place], you may choose to seek medical care at your doctor's office, retail clinic, urgent care center, or emergency room.  If you have a medical emergency, please immediately call 911 or go to the emergency department.  Pager Numbers  - Dr. Nehemiah Massed: [phone]  - Dr. Laurence Ferrari: [phone]  - Dr. Nicole Kindred: [phone]  In the event of inclement weather, please call our main line at [phone] for an update on the status of any delays or closures.  Dermatology Medication Tips: Please keep the boxes that topical medications come in in order to help keep track of the instructions about where and how to use these. Pharmacies typically print the medication instructions only on the boxes and not directly on the medication tubes.   If your medication is too expensive, please contact our office at [phone] option 4 or send [place] a message through [place].   We are unable to tell what your co-pay for medications will be in advance as this is different depending on your insurance coverage. However, we may be able to find a substitute  medication at lower cost or fill out paperwork to get insurance to cover a needed medication.   If a prior authorization is required to get your medication covered by your insurance company, please allow [place] 1-2 business days to complete this process.  Drug prices often vary depending on where the prescription is filled and some pharmacies may offer cheaper prices.  The website www.goodrx.com contains coupons for medications through different pharmacies. The prices here do not account for what the cost may be with help from insurance (it may be cheaper with your insurance), but the website can give you the price if you did not use any insurance.  - You can print the associated coupon and take it with your prescription to the pharmacy.  - You may also stop by our office during regular business hours and pick up a GoodRx coupon card.  - If you need your prescription sent electronically to a different pharmacy, notify our office through [facility] or by phone at [phone] option 4.  Scabies, Adult  Scabies is a skin condition that happens when very small insects called mites get under the skin (infestation). This causes a rash and severe itchiness. Scabies is contagious, which means it can spread from person to person. If you get scabies, it is common for others in your household to get scabies too. With proper treatment, symptoms usually go away in 2-4 weeks. Scabies usually does not cause lasting problems. What are  the causes? This condition is caused by tiny mites (Sarcoptes scabiei, or human itch mites) that can only be seen with a microscope. The mites get into the top layer of skin and lay eggs. Scabies can spread from person to person through:  Close contact with a person who has scabies.  Sharing or having contact with infested items, such as towels, bedding, or clothing. What increases the risk? The following factors may make you more likely to develop this condition:  Living  in a nursing home or other extended care facility.  Having sexual contact with a partner who has scabies.  Caring for others who are at increased risk for scabies. What are the signs or symptoms? Symptoms of this condition include:  Severe itchiness. This is often worse at night.  A rash that includes tiny red bumps or blisters. The rash commonly occurs on the hands, wrists, elbows, armpits, chest, waist, groin, or buttocks. The bumps may form a line (burrow) in some areas.  Skin irritation. This can include scaly patches or sores. How is this diagnosed? This condition may be diagnosed based on:  A physical exam of the skin.  A skin test. Your health care provider may take a sample of your affected skin (skin scraping) and have it examined under a microscope for signs of mites. How is this treated? This condition may be treated with:  Medicated cream or lotion that kills the mites. This is spread on the entire body and left on for several hours. Usually, one treatment with medicated cream or lotion is enough to kill all the mites. In severe cases, the treatment may need to be repeated.  Medicated cream that relieves itching.  Medicines taken by mouth (orally) that: ? Relieve itching. ? Reduce the swelling and redness. ? Kill the mites. This treatment may be done in severe cases. Follow these instructions at home: Medicines  Take or apply over-the-counter and prescription medicines only as told by your health care provider.  Apply medicated cream or lotion as told by your health care provider.  Do not wash off the medicated cream or lotion until the necessary amount of time has passed. Skin care  Avoid scratching the affected areas of your skin.  Keep your fingernails closely trimmed to reduce injury from scratching.  Take cool baths or apply cool washcloths to your skin to help reduce itching. General instructions  Clean all items that you had contact with during the 3  days before diagnosis. This includes bedding, clothing, towels, and furniture. Do this on the same day that you start treatment. ? Dry-clean items, or use hot water to wash items. Dry items on the hot dry cycle. ? Place items that cannot be washed into closed, airtight plastic bags for at least 3 days. The mites cannot live for more than 3 days away from human skin. ? Vacuum furniture and mattresses that you use.  Make sure that other people who may have been infested are examined by a health care provider. These include members of your household and anyone who may have had contact with infested items.  Keep all follow-up visits. This is important. Where to find more information  Centers for Disease Control and Prevention: http://www.wolf.info/ Contact a health care provider if:  You have itching that does not go away after 4 weeks of treatment.  You continue to develop new bumps or burrows.  You have redness, swelling, or pain in your rash area after treatment.  You have fluid, blood,  or pus coming from your rash. Summary  Scabies is a skin condition that causes a rash and severe itchiness.  This condition is caused by tiny mites that get into the top layer of the skin and lay eggs.  Scabies can spread from person to person.  Follow treatments as recommended by your health care provider.  Clean all items that you recently had contact with. This information is not intended to replace advice given to you by your health care provider. Make sure you discuss any questions you have with your health care provider. Document Revised: 02/06/2020 Document Reviewed: 02/06/2020 Elsevier Patient Education  [place].

## 2021-03-29 ENCOUNTER — Other Ambulatory Visit: Payer: Self-pay

## 2021-03-29 ENCOUNTER — Encounter: Payer: Self-pay | Admitting: Dermatology

## 2021-03-29 DIAGNOSIS — R21 Rash and other nonspecific skin eruption: Secondary | ICD-10-CM

## 2021-03-29 MED ORDER — CLOBETASOL PROPIONATE 0.05 % EX FOAM: CUTANEOUS | 0 refills | Status: DC

## 2021-03-29 NOTE — Progress Notes (Signed)
Pharmacy change due to pricing.

## 2021-03-30 NOTE — Telephone Encounter (Signed)
Called patient and informed them of results and recommendations. Patient verbalized understanding and denied further questions at this time.

## 2021-03-30 NOTE — Telephone Encounter (Signed)
-----   Message from Alfonso Patten, MD sent at 03/30/2021 11:44 AM EDT ----- Skin , B/L hands, feet, trunk SUPERFICIAL AND DEEP PERIVASCULAR DERMATITIS WITH EOSINOPHILS, SEE DESCRIPTION "Consistent with allergic reaction/hypersensitivity reaction. This can be seen with scabies, bug bites, or drug rashes"  Microscopic Description There is a superficial and deep perivascular and interstitial infiltrate of lymphocytes, histiocytes and eosinophils. There is spongiosis of the epidermis with some scale. This pattern suggests a hypersensitivity reaction and can be seen in an arthropod bite reaction or in a systemic hypersensitivity reaction such as a drug reaction. In addition, these changes could be seen in scabies though no organisms are seen in these sections. Following review of the hematoxylin and eosin sections, a PAS stain was obtained to exclude a fungal infection. The PAS stain is negative for fungal organisms. Multiple levels taken through the submitted block are examined. No scabies mites are identified.  Will recheck her progress at suture removal and adjust therapy if needed.  MAs please call and let me know if patient is not doing well Thank you!

## 2021-04-07 ENCOUNTER — Other Ambulatory Visit: Payer: Self-pay

## 2021-04-07 ENCOUNTER — Ambulatory Visit: Payer: 59 | Admitting: Dermatology

## 2021-04-07 ENCOUNTER — Encounter: Payer: Self-pay | Admitting: Dermatology

## 2021-04-07 DIAGNOSIS — R21 Rash and other nonspecific skin eruption: Secondary | ICD-10-CM | POA: Diagnosis not present

## 2021-04-07 MED ORDER — HALOBETASOL PROPIONATE 0.05 % EX OINT: TOPICAL_OINTMENT | Freq: Every day | CUTANEOUS | 0 refills | Status: DC

## 2021-04-07 NOTE — Patient Instructions (Signed)
Exam and biopsy results most c/w bite reaction Already treated for scabies (second treatment tonight)  Recommend having house treated a second time for bed bugs. Can consider outdoor treatments for insects as well.   Recommend bug repellent with DEET (higher percentage) or picaridin. Reapply as directed and be very consistent with use.   Discontinue clobetasol foam  Start halobetasol ointment daily under bandaid to stubborn areas. Avoid applying to face, groin, and axilla. Use as directed. Risk of skin atrophy with long-term use reviewed.   Can try zyrtec (cetirizine) or claritin (loratadine) twice daily. Can use benadryl at night as needed for itch if needed to help with sleep.  Recommend OTC [place] Rapid Relief Anti-Itch cream (pramoxine + menthol) up to 3 times per day to areas that are itchy.   If you have any questions or concerns for your doctor, please call our main line at [phone] and press option 4 to reach your doctor's medical assistant. If no one answers, please leave a voicemail as directed and we will return your call as soon as possible. Messages left after 4 pm will be answered the following business day.   You may also send [place] a message via [place]. We typically respond to MyChart messages within 1-2 business days.  For prescription refills, please ask your pharmacy to contact our office. Our fax number is [phone].  If you have an urgent issue when the clinic is closed that cannot wait until the next business day, you can page your doctor at the number below.    Please note that while we do our best to be available for urgent issues outside of office hours, we are not available 24/7.   If you have an urgent issue and are unable to reach [place], you may choose to seek medical care at your doctor's office, retail clinic, urgent care center, or emergency room.  If you have a medical emergency, please immediately call 911 or go to the emergency department.  Pager  Numbers  - Dr. Nehemiah Massed: [phone]  - Dr. Laurence Ferrari: [phone]  - Dr. Nicole Kindred: [phone]  In the event of inclement weather, please call our main line at [phone] for an update on the status of any delays or closures.  Dermatology Medication Tips: Please keep the boxes that topical medications come in in order to help keep track of the instructions about where and how to use these. Pharmacies typically print the medication instructions only on the boxes and not directly on the medication tubes.   If your medication is too expensive, please contact our office at [phone] option 4 or send [place] a message through [place].   We are unable to tell what your co-pay for medications will be in advance as this is different depending on your insurance coverage. However, we may be able to find a substitute medication at lower cost or fill out paperwork to get insurance to cover a needed medication.   If a prior authorization is required to get your medication covered by your insurance company, please allow [place] 1-2 business days to complete this process.  Drug prices often vary depending on where the prescription is filled and some pharmacies may offer cheaper prices.  The website www.goodrx.com contains coupons for medications through different pharmacies. The prices here do not account for what the cost may be with help from insurance (it may be cheaper with your insurance), but the website can give you the price if you did not use any insurance.  -  You can print the associated coupon and take it with your prescription to the pharmacy.  - You may also stop by our office during regular business hours and pick up a GoodRx coupon card.  - If you need your prescription sent electronically to a different pharmacy, notify our office through [facility] or by phone at [phone] option 4.

## 2021-04-07 NOTE — Progress Notes (Signed)
   Follow-Up Visit   Subjective  Monique Mcmahon is a 35 y.o. female who presents for the following: Follow-up (Biopsy follow up - Scabies vs drug reaction - Permethrin cream - has treated once and will do 2nd treatment tonight. She is using Clobetasol foam but it is not helping. She did get in her parents salt water pool for a couple of days and it seemed to help but she has had some new spots come up since. She did have house treated for bed bugs in January when her rash started. She does spend a lot of time outdoors but she uses bug repellent. She does not have any animals nor is she around any animals.).  The following portions of the chart were reviewed this encounter and updated as appropriate:   Tobacco  Allergies  Meds  Problems  Med Hx  Surg Hx  Fam Hx       Review of Systems:  No other skin or systemic complaints except as noted in HPI or Assessment and Plan.  Objective  Well appearing patient in no apparent distress; mood and affect are within normal limits.  A focused examination was performed including upper extremities, including the arms, hands, fingers, and fingernails. Relevant physical exam findings are noted in the Assessment and Plan.  Arms and Legs Few clustered and scattered edematous pink papules   Assessment & Plan  Rash Arms and Legs  Exam and biopsy results most c/w bite reaction Already treated for scabies (second treatment tonight)  Recommend having house treated a second time for bed bugs. Can consider outdoor treatments for insects as well.   Recommend bug repellent with DEET (higher percentage) or picaridin. Reapply as directed and be very consistent with use.   D/c clobetasol foam  Start halobetasol ointment daily under bandaid to stubborn areas. Avoid applying to face, groin, and axilla. Use as directed. Risk of skin atrophy with long-term use reviewed.   Can try zyrtec (cetirizine) or claritin (loratadine) twice daily. Can use benadryl  at night as needed for itch if needed to help with sleep.  Recommend OTC [place] Rapid Relief Anti-Itch cream (pramoxine + menthol) up to 3 times per day to areas that are itchy.  If continuing to get new lesions recommend exterminator  halobetasol (ULTRAVATE) 0.05 % ointment - Arms and Legs Apply topically daily. Under bandaid for up to 1 week  Return in about 3 months (around 07/08/2021).  I, Ashok Cordia, CMA, am acting as scribe for Forest Gleason, MD .  Documentation: I have reviewed the above documentation for accuracy and completeness, and I agree with the above.  Forest Gleason, MD

## 2021-04-20 ENCOUNTER — Encounter: Payer: Self-pay | Admitting: Dermatology

## 2021-05-19 ENCOUNTER — Other Ambulatory Visit: Payer: Self-pay | Admitting: Family Medicine

## 2021-05-19 DIAGNOSIS — L299 Pruritus, unspecified: Secondary | ICD-10-CM

## 2021-05-19 NOTE — Telephone Encounter (Signed)
   Notes to clinic:  Review for refill Looks like patient reported not taking on 03/22/2021   Requested Prescriptions  Pending Prescriptions Disp Refills   venlafaxine XR (EFFEXOR-XR) 75 MG 24 hr capsule [Pharmacy Med Name: VENLAFAXINE HCL ER 75 MG CAP] 30 capsule     Sig: TAKE 1 CAPSULE (75 MG) BY MOUTH EVERY DAY      Psychiatry: Antidepressants - SNRI - desvenlafaxine & venlafaxine Failed - 05/19/2021  8:55 AM      Failed - LDL in normal range and within 360 days    LDL Cholesterol  Date Value Ref Range Status  10/05/2010 145 mg/dL Final          Failed - Total Cholesterol in normal range and within 360 days    Cholesterol  Date Value Ref Range Status  10/05/2010 237 (A) 0 - 200 mg/dL Final          Failed - Triglycerides in normal range and within 360 days    Triglycerides  Date Value Ref Range Status  10/05/2010 134 40 - 160 mg/dL Final          Failed - Completed PHQ-2 or PHQ-9 in the last 360 days      Passed - Last BP in normal range    BP Readings from Last 1 Encounters:  03/18/21 131/80          Passed - Valid encounter within last 6 months    Recent Outpatient Visits           2 months ago [place], PA-C   3 months ago Rash and nonspecific skin eruption   [facility] [employer], Laurita Quint, FNP   4 years ago Maculopapular rash   [place], [place], [place]   5 years ago RUQ abdominal pain   [place], Vickki Muff, PA-C       Future Appointments             In 2 months [place], [place], MD [place]

## 2021-07-20 ENCOUNTER — Ambulatory Visit: Payer: 59 | Admitting: Dermatology

## 2021-11-21 NOTE — Progress Notes (Signed)
PCP:  Chrismon, Vickki Muff, PA-C (Inactive)   Chief Complaint  Patient presents with   Gynecologic Exam    No concerns     HPI:      [name] is a 36 y.o. No obstetric history on file. who LMP was No LMP recorded. Patient has had an implant., presents today for her annual examination.  Her menses are absent with nexplanon. Occas BTB, mild dysmen.   Sex activity: single partner, contraception - Nexplanon REplaced 09/30/19. Partner had vasectomy recently. Pt plans to have nexplanon removed once he is cleared, approx late March.   Last Pap: 11/17/20 Results were LGSIL/neg HPV DNA. 09/30/19 Results were normal, neg HPV DNA.  09/2016 Results were: low-grade squamous intraepithelial neoplasia (LGSIL - encompassing HPV,mild dysplasia,CIN I) /neg HPV DNA. Neg colpo bx with Dr. Georgianne Fick 1/18.  Also with LGSIL 2016 and neg colpo bx 2017. Repeat pap due today. 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 There is no FH of ovarian cancer. The patient does self-breast exams.  Tobacco use: smokes 1 ppd, not ready to quit Alcohol use: few days weekly Daily marijuana use.  Exercise: min active  She does get adequate calcium but not Vitamin D in her diet.  Patient Active Problem List   Diagnosis Date Noted   LGSIL on Pap smear of cervix 11/17/2020   Acute anxiety 01/21/2016   Depression 01/21/2016    Past Surgical History:  Procedure Laterality Date   COLPOSCOPY  01/04/2006    Family History  Problem Relation Age of Onset   Anemia Mother    Hypertension Father    Ulcerative colitis Sister    Parkinson's disease Maternal Grandmother    Prostate cancer Maternal Grandfather     Social History   Socioeconomic History   Marital status: Single    Spouse name: Not on file   Number of children: Not on file   Years of education: Not on file   Highest education level: Not on file  Occupational History   Not on file  Tobacco Use   Smoking status: Every Day     Packs/day: 0.50    Types: Cigarettes   Smokeless tobacco: Never  Vaping Use   Vaping Use: Never used  Substance and Sexual Activity   Alcohol use: Yes    Alcohol/week: 0.0 standard drinks   Drug use: Never   Sexual activity: Yes    Birth control/protection: Implant  Other Topics Concern   Not on file  Social History Narrative   Not on file   Social Determinants of Health   Financial Resource Strain: Not on file  Food Insecurity: Not on file  Transportation Needs: Not on file  Physical Activity: Not on file  Stress: Not on file  Social Connections: Not on file  Intimate Partner Violence: Not on file     Current Outpatient Medications:    etonogestrel (NEXPLANON) 68 MG IMPL implant, 1 each (68 mg total) by Subdermal route once for 1 dose., Disp: 1 each, Rfl: 0   ROS:  Review of Systems  Constitutional:  Negative for fatigue, fever and unexpected weight change.  Respiratory:  Negative for cough, shortness of breath and wheezing.   Cardiovascular:  Negative for chest pain, palpitations and leg swelling.  Gastrointestinal:  Negative for blood in stool, constipation, diarrhea, nausea and vomiting.  Endocrine: Negative for cold intolerance, heat intolerance and polyuria.  Genitourinary:  Negative for dyspareunia, dysuria, flank pain, frequency,  genital sores, hematuria, menstrual problem, pelvic pain, urgency, vaginal bleeding, vaginal discharge and vaginal pain.  Musculoskeletal:  Negative for back pain, joint swelling and myalgias.  Skin:  Negative for rash.  Neurological:  Negative for dizziness, syncope, light-headedness, numbness and headaches.  Hematological:  Negative for adenopathy.  Psychiatric/Behavioral:  Negative for agitation, confusion, dysphoric mood, sleep disturbance and suicidal ideas. The patient is not nervous/anxious.  BREAST: No symptoms   Objective: BP 136/80    Ht 5\' 6"  (1.676 m)    Wt 265 lb (120.2 kg)    BMI 42.77 kg/m    Physical  Exam Constitutional:      Appearance: She is well-developed.  Genitourinary:     Vulva normal.     Right Labia: No rash, tenderness or lesions.    Left Labia: No tenderness, lesions or rash.    No vaginal discharge, erythema or tenderness.      Right Adnexa: not tender and no mass present.    Left Adnexa: not tender and no mass present.    No cervical friability or polyp.     Uterus is not enlarged or tender.  Breasts:    Right: No mass, nipple discharge, skin change or tenderness.     Left: No mass, nipple discharge, skin change or tenderness.  Neck:     Thyroid: No thyromegaly.  Cardiovascular:     Rate and Rhythm: Normal rate and regular rhythm.     Heart sounds: Normal heart sounds. No murmur heard. Pulmonary:     Effort: Pulmonary effort is normal.     Breath sounds: Normal breath sounds.  Abdominal:     Palpations: Abdomen is soft.     Tenderness: There is no abdominal tenderness. There is no guarding or rebound.  Musculoskeletal:        General: Normal range of motion.     Cervical back: Normal range of motion.  Lymphadenopathy:     Cervical: No cervical adenopathy.  Neurological:     General: No focal deficit present.     Mental Status: She is alert and oriented to person, place, and time.     Cranial Nerves: No cranial nerve deficit.  Skin:    General: Skin is warm and dry.  Psychiatric:        Mood and Affect: Mood normal.        Behavior: Behavior normal.        Thought Content: Thought content normal.        Judgment: Judgment normal.  Vitals reviewed.   Assessment/Plan: Encounter for annual routine gynecological examination  Cervical cancer screening - Plan: Cytology - PAP  Screening for HPV (human papillomavirus) - Plan: Cytology - PAP  LGSIL on Pap smear of cervix - Plan: Cytology - PAP; repeat pap today. Will f/u with results.   Encounter for surveillance of implantable subdermal contraceptive--pt will RTO for removal 3/23   GYN counsel  adequate intake of calcium and vitamin D, diet and exercise; tobacco and marijuana cessation     F/U  Return in about 1 year (around 11/22/2022).  Larenzo Caples B. Sharita Bienaime, PA-C 11/22/2021 8:51 AM

## 2021-11-22 ENCOUNTER — Other Ambulatory Visit (HOSPITAL_COMMUNITY)
Admission: RE | Admit: 2021-11-22 | Discharge: 2021-11-22 | Disposition: A | Discharge status: Home / Self Care | Payer: 59 | Source: Ambulatory Visit | Attending: Obstetrics and Gynecology | Admitting: Obstetrics and Gynecology

## 2021-11-22 ENCOUNTER — Encounter: Payer: Self-pay | Admitting: Obstetrics and Gynecology

## 2021-11-22 ENCOUNTER — Ambulatory Visit (INDEPENDENT_AMBULATORY_CARE_PROVIDER_SITE_OTHER): Payer: 59 | Admitting: Obstetrics and Gynecology

## 2021-11-22 ENCOUNTER — Other Ambulatory Visit: Payer: Self-pay

## 2021-11-22 VITALS — BP 136/80 | Ht 66.0 in | Wt 265.0 lb

## 2021-11-22 DIAGNOSIS — Z1151 Encounter for screening for human papillomavirus (HPV): Secondary | ICD-10-CM | POA: Insufficient documentation

## 2021-11-22 DIAGNOSIS — R87612 Low grade squamous intraepithelial lesion on cytologic smear of cervix (LGSIL): Secondary | ICD-10-CM

## 2021-11-22 DIAGNOSIS — Z01419 Encounter for gynecological examination (general) (routine) without abnormal findings: Secondary | ICD-10-CM

## 2021-11-22 DIAGNOSIS — Z124 Encounter for screening for malignant neoplasm of cervix: Secondary | ICD-10-CM

## 2021-11-22 DIAGNOSIS — Z3046 Encounter for surveillance of implantable subdermal contraceptive: Secondary | ICD-10-CM

## 2021-11-22 NOTE — Patient Instructions (Signed)
I value your feedback and you entrusting us with your care. If you get a [facility] patient survey, I would appreciate you taking the time to let us know about your experience today. Thank you! ? ? ?

## 2021-11-24 LAB — CYTOLOGY - PAP
Adequacy: ABSENT
Comment: NEGATIVE
Diagnosis: NEGATIVE
High risk HPV: NEGATIVE

## 2022-01-12 NOTE — Progress Notes (Signed)
? ?  Chief Complaint  ?Patient presents with  ? Nexplanon removal  ?  Not interested in new [facility]  ? ? ? ?History of Present Illness:  Monique Mcmahon is a 36 y.o. that had a nexplanon REplaced 09/30/19. Partner had vasectomy and pt ready to have it removed.  ? ?BP 140/90   Ht '5\' 6"'$  (1.676 m)   Wt 262 lb (118.8 kg)   BMI 42.29 kg/m?  ? ? ?Nexplanon removal ?Procedure note - The Nexplanon was noted in the patient's arm and the end was identified. The skin was cleansed with a Betadine solution. A small injection of subcutaneous lidocaine with epinephrine was given over the end of the implant. An incision was made at the end of the implant. The rod was noted in the incision and grasped with a hemostat. It was noted to be intact.  Steri-Strip was placed approximating the incision. Hemostasis was noted. ? ?Assessment: ?Nexplanon removal ? ? ?Plan:   ?She was told to remove the dressing in 12-24 hours, to keep the incision area dry for 24 hours and to remove the Steristrip in 2-3  days.  ?Notify us if any signs of tenderness, redness, pain, or fevers develop. ? ? ?Kassie Keng B. Zaire Vanbuskirk, PA-C ?01/16/2022 ?8:57 AM  ?

## 2022-01-16 ENCOUNTER — Other Ambulatory Visit: Payer: Self-pay

## 2022-01-16 ENCOUNTER — Encounter: Payer: Self-pay | Admitting: Obstetrics and Gynecology

## 2022-01-16 ENCOUNTER — Ambulatory Visit: Payer: 59 | Admitting: Obstetrics and Gynecology

## 2022-01-16 VITALS — BP 140/90 | Ht 66.0 in | Wt 262.0 lb

## 2022-01-16 DIAGNOSIS — Z3046 Encounter for surveillance of implantable subdermal contraceptive: Secondary | ICD-10-CM | POA: Diagnosis not present

## 2022-01-16 NOTE — Patient Instructions (Signed)
I value your feedback and you entrusting us with your care. If you get a [facility] patient survey, I would appreciate you taking the time to let us know about your experience today. Thank you!  Remove the dressing in 24 hours,  keep the incision area dry for 24 hours and remove the Steristrip in 2-3  days.  Notify us if any signs of tenderness, redness, pain, or fevers develop.   

## 2022-04-12 ENCOUNTER — Encounter: Payer: Self-pay | Admitting: Dermatology

## 2022-04-12 ENCOUNTER — Ambulatory Visit: Payer: 59 | Admitting: Dermatology

## 2022-04-12 DIAGNOSIS — Z1283 Encounter for screening for malignant neoplasm of skin: Secondary | ICD-10-CM | POA: Diagnosis not present

## 2022-04-12 DIAGNOSIS — D1800 Hemangioma unspecified site: Secondary | ICD-10-CM

## 2022-04-12 DIAGNOSIS — L814 Other melanin hyperpigmentation: Secondary | ICD-10-CM

## 2022-04-12 DIAGNOSIS — Z808 Family history of malignant neoplasm of other organs or systems: Secondary | ICD-10-CM

## 2022-04-12 DIAGNOSIS — L821 Other seborrheic keratosis: Secondary | ICD-10-CM | POA: Diagnosis not present

## 2022-04-12 DIAGNOSIS — L578 Other skin changes due to chronic exposure to nonionizing radiation: Secondary | ICD-10-CM | POA: Diagnosis not present

## 2022-04-12 DIAGNOSIS — D229 Melanocytic nevi, unspecified: Secondary | ICD-10-CM

## 2022-04-12 DIAGNOSIS — D2371 Other benign neoplasm of skin of right lower limb, including hip: Secondary | ICD-10-CM

## 2022-04-12 NOTE — Patient Instructions (Addendum)
Call and let us know what kind of skin cancer your sister had    Recommend taking Heliocare sun protection supplement daily in sunny weather for additional sun protection. For maximum protection on the sunniest days, you can take up to 2 capsules of regular Heliocare OR take 1 capsule of Heliocare Ultra. For prolonged exposure (such as a full day in the sun), you can repeat your dose of the supplement 4 hours after your first dose. Heliocare can be purchased at [employer], at some [facility] or at [email or web address].    Seborrheic Keratosis or wisdom spots  What causes seborrheic keratoses? Seborrheic keratoses are harmless, common skin growths that first appear during adult life.  As time goes by, more growths appear.  Some people may develop a large number of them.  Seborrheic keratoses appear on both covered and uncovered body parts.  They are not caused by sunlight.  The tendency to develop seborrheic keratoses can be inherited.  They vary in color from skin-colored to gray, brown, or even black.  They can be either smooth or have a rough, warty surface.   Seborrheic keratoses are superficial and look as if they were stuck on the skin.  Under the microscope this type of keratosis looks like layers upon layers of skin.  That is why at times the top layer may seem to fall off, but the rest of the growth remains and re-grows.    Treatment Seborrheic keratoses do not need to be treated, but can easily be removed in the office.  Seborrheic keratoses often cause symptoms when they rub on clothing or jewelry.  Lesions can be in the way of shaving.  If they become inflamed, they can cause itching, soreness, or burning.  Removal of a seborrheic keratosis can be accomplished by freezing, burning, or surgery. If any spot bleeds, scabs, or grows rapidly, please return to have it checked, as these can be an indication of a skin cancer.   Melanoma ABCDEs  Melanoma is the most dangerous type of skin  cancer, and is the leading cause of death from skin disease.  You are more likely to develop melanoma if you: Have light-colored skin, light-colored eyes, or red or blond hair Spend a lot of time in the sun Tan regularly, either outdoors or in a tanning bed Have had blistering sunburns, especially during childhood Have a close family member who has had a melanoma Have atypical moles or large birthmarks  Early detection of melanoma is key since treatment is typically straightforward and cure rates are extremely high if we catch it early.   The first sign of melanoma is often a change in a mole or a new dark spot.  The ABCDE system is a way of remembering the signs of melanoma.  A for asymmetry:  The two halves do not match. B for border:  The edges of the growth are irregular. C for color:  A mixture of colors are present instead of an even brown color. D for diameter:  Melanomas are usually (but not always) greater than 66m - the size of a pencil eraser. E for evolution:  The spot keeps changing in size, shape, and color.  Please check your skin once per month between visits. You can use a small mirror in front and a large mirror behind you to keep an eye on the back side or your body.   If you see any new or changing lesions before your next follow-up, please call to schedule  a visit.  Please continue daily skin protection including broad spectrum sunscreen SPF 30+ to sun-exposed areas, reapplying every 2 hours as needed when you're outdoors.   Staying in the shade or wearing long sleeves, sun glasses (UVA+UVB protection) and wide brim hats (4-inch brim around the entire circumference of the hat) are also recommended for sun protection.    Due to recent changes in healthcare laws, you may see results of your pathology and/or laboratory studies on MyChart before the doctors have had a chance to review them. We understand that in some cases there may be results that are confusing or  concerning to you. Please understand that not all results are received at the same time and often the doctors may need to interpret multiple results in order to provide you with the best plan of care or course of treatment. Therefore, we ask that you please give [place] 2 business days to thoroughly review all your results before contacting the office for clarification. Should we see a critical lab result, you will be contacted sooner.   If You Need Anything After Your Visit  If you have any questions or concerns for your doctor, please call our main line at [phone] and press option 4 to reach your doctor's medical assistant. If no one answers, please leave a voicemail as directed and we will return your call as soon as possible. Messages left after 4 pm will be answered the following business day.   You may also send [place] a message via [place]. We typically respond to MyChart messages within 1-2 business days.  For prescription refills, please ask your pharmacy to contact our office. Our fax number is [phone].  If you have an urgent issue when the clinic is closed that cannot wait until the next business day, you can page your doctor at the number below.    Please note that while we do our best to be available for urgent issues outside of office hours, we are not available 24/7.   If you have an urgent issue and are unable to reach [place], you may choose to seek medical care at your doctor's office, retail clinic, urgent care center, or emergency room.  If you have a medical emergency, please immediately call 911 or go to the emergency department.  Pager Numbers  - Dr. Nehemiah Massed: [phone]  - Dr. Laurence Ferrari: [phone]  - Dr. Nicole Kindred: [phone]  In the event of inclement weather, please call our main line at [phone] for an update on the status of any delays or closures.  Dermatology Medication Tips: Please keep the boxes that topical medications come in in order to help keep track  of the instructions about where and how to use these. Pharmacies typically print the medication instructions only on the boxes and not directly on the medication tubes.   If your medication is too expensive, please contact our office at [phone] option 4 or send [place] a message through [place].   We are unable to tell what your co-pay for medications will be in advance as this is different depending on your insurance coverage. However, we may be able to find a substitute medication at lower cost or fill out paperwork to get insurance to cover a needed medication.   If a prior authorization is required to get your medication covered by your insurance company, please allow [place] 1-2 business days to complete this process.  Drug prices often vary depending on where the prescription is filled and some pharmacies may offer  cheaper prices.  The website www.goodrx.com contains coupons for medications through different pharmacies. The prices here do not account for what the cost may be with help from insurance (it may be cheaper with your insurance), but the website can give you the price if you did not use any insurance.  - You can print the associated coupon and take it with your prescription to the pharmacy.  - You may also stop by our office during regular business hours and pick up a GoodRx coupon card.  - If you need your prescription sent electronically to a different pharmacy, notify our office through [facility] or by phone at [phone] option 4.     Si Usted Necesita Algo Despus de Su Visita  Tambin puede enviarnos un mensaje a travs de Pharmacist, community. Por lo general respondemos a los mensajes de MyChart en el transcurso de 1 a 2 das hbiles.  Para renovar recetas, por favor pida a su farmacia que se ponga en contacto con nuestra oficina. Harland Dingwall de fax es [place] [phone].  Si tiene un asunto urgente cuando la clnica est cerrada y que no puede esperar hasta el siguiente da  hbil, puede llamar/localizar a su doctor(a) al nmero que aparece a continuacin.   Por favor, tenga en cuenta que aunque hacemos todo lo posible para estar disponibles para asuntos urgentes fuera del horario de [place], no estamos disponibles las 24 horas del da, los 7 das de la [place].   Si tiene un problema urgente y no puede comunicarse con nosotros, puede optar por buscar atencin mdica  en el consultorio de su doctor(a), en una clnica privada, en un centro de atencin urgente o en una sala de emergencias.  Si tiene Engineering geologist, por favor llame inmediatamente al 911 o vaya a la sala de emergencias.  Nmeros de bper  - Dr. Nehemiah Massed: [phone]  - Dra. Moye: [phone]  - Dra. Nicole Kindred: [phone]  En caso de inclemencias del [place], por favor llame a Johnsie Kindred principal al [phone] para una actualizacin sobre el [place] de cualquier retraso o cierre.  Consejos para la medicacin en dermatologa: Por favor, guarde las cajas en las que vienen los medicamentos de uso tpico para ayudarle a seguir las instrucciones sobre dnde y cmo usarlos. Las farmacias generalmente imprimen las instrucciones del medicamento slo en las cajas y no directamente en los tubos del [place].   Si su medicamento es muy caro, por favor, pngase en contacto con Zigmund Daniel llamando al [phone] y presione la opcin 4 o envenos un mensaje a travs de Pharmacist, community.   No podemos decirle cul ser su copago por los medicamentos por adelantado ya que esto es diferente dependiendo de la cobertura de su seguro. Sin embargo, es posible que podamos encontrar un medicamento sustituto a Electrical engineer un formulario para que el seguro cubra el medicamento que se considera necesario.   Si se requiere una autorizacin previa para que su compaa de seguros [place] su medicamento, por favor permtanos de 1 a 2 das hbiles para completar este proceso.  Los precios de los medicamentos  varan con frecuencia dependiendo del Environmental consultant de dnde se surte la receta y alguna farmacias pueden ofrecer precios ms baratos.  El sitio web www.goodrx.com tiene cupones para medicamentos de Airline pilot. Los precios aqu no tienen en cuenta lo que podra costar con la ayuda del seguro (puede ser ms barato con su seguro), pero el sitio web puede darle el precio si no utiliz ningn  seguro.  - Puede imprimir el cupn correspondiente y llevarlo con su receta a la farmacia.  - Tambin puede pasar por nuestra oficina durante el horario de atencin regular y Charity fundraiser una tarjeta de cupones de GoodRx.  - Si necesita que su receta se enve electrnicamente a una farmacia diferente, informe a nuestra oficina a travs de MyChart de [place] o por telfono llamando al [phone] y presione la opcin 4.

## 2022-04-12 NOTE — Progress Notes (Signed)
   Follow-Up Visit   Subjective  Monique Mcmahon is a 36 y.o. female who presents for the following: Annual Exam (Tbse. Reports family hx of skin cancer in sister. ).  The patient presents for Total-Body Skin Exam (TBSE) for skin cancer screening and mole check.  The patient has spots, moles and lesions to be evaluated, some may be new or changing and the patient has concerns that these could be cancer.  The following portions of the chart were reviewed this encounter and updated as appropriate:  Tobacco  Allergies  Meds  Problems  Med Hx  Surg Hx  Fam Hx      Review of Systems: No other skin or systemic complaints except as noted in HPI or Assessment and Plan.   Objective  Well appearing patient in no apparent distress; mood and affect are within normal limits.  A full examination was performed including scalp, head, eyes, ears, nose, lips, neck, chest, axillae, abdomen, back, buttocks, bilateral upper extremities, bilateral lower extremities, hands, feet, fingers, toes, fingernails, and toenails. All findings within normal limits unless otherwise noted below.   Assessment & Plan  Lentigines - Scattered tan macules - Due to sun exposure - Benign-appearing, observe - Recommend daily broad spectrum sunscreen SPF 30+ to sun-exposed areas, reapply every 2 hours as needed. - Call for any changes  Seborrheic Keratoses - Stuck-on, waxy, tan-brown papules and/or plaques  - Benign-appearing - Discussed benign etiology and prognosis. - Observe - Call for any changes  Melanocytic Nevi - Tan-brown and/or pink-flesh-colored symmetric macules and papules - Benign appearing on exam today - Observation - Call clinic for new or changing moles - Recommend daily use of broad spectrum spf 30+ sunscreen to sun-exposed areas.   Dermatofibroma At right medial ankle  - Firm pink/brown papulenodule with dimple sign - Benign appearing - Call for any changes  Hemangiomas - Red  papules - Discussed benign nature - Observe - Call for any changes  Actinic Damage - Chronic condition, secondary to cumulative UV/sun exposure - diffuse scaly erythematous macules with underlying dyspigmentation - Recommend daily broad spectrum sunscreen SPF 30+ to sun-exposed areas, reapply every 2 hours as needed.  - Staying in the shade or wearing long sleeves, sun glasses (UVA+UVB protection) and wide brim hats (4-inch brim around the entire circumference of the hat) are also recommended for sun protection.  - Call for new or changing lesions.  Skin cancer screening performed today. Return if symptoms worsen or fail to improve, for 1 - 2 year tbse . I, Ruthell Rummage, CMA, am acting as scribe for Forest Gleason, MD.  Documentation: I have reviewed the above documentation for accuracy and completeness, and I agree with the above.  Forest Gleason, MD

## 2022-05-18 ENCOUNTER — Other Ambulatory Visit: Payer: Self-pay | Admitting: Obstetrics and Gynecology

## 2022-11-20 ENCOUNTER — Other Ambulatory Visit: Payer: Self-pay | Admitting: Obstetrics and Gynecology

## 2022-11-27 ENCOUNTER — Other Ambulatory Visit: Payer: Self-pay

## 2022-11-27 MED ORDER — VALACYCLOVIR HCL 500 MG PO TABS: ORAL_TABLET | ORAL | 0 refills | Status: DC

## 2022-11-27 NOTE — Telephone Encounter (Signed)
Patient called. She needs a refill on Valtrex. She went to pharmacy and prescription was denied. I advised patient to schedule annual exam and we would send in medication to last until her appointment on March 12. She verbalized understanding.

## 2023-01-01 NOTE — Progress Notes (Unsigned)
PCP:  Chrismon, Vickki Muff, PA-C (Inactive)   No chief complaint on file.    HPI:      [name] is a 37 y.o. No obstetric history on file. who LMP was No LMP recorded. Patient has had an implant., presents today for her annual examination.  Her menses are absent with nexplanon. Occas BTB, mild dysmen.   Sex activity: single partner, contraception - Nexplanon removed 3/23. Partner had vasectomy.  Last Pap: 11/22/21 Results were no abnormalities, neg HPV DNA. Repeat in 1 yr.  11/17/20 Results were LGSIL/neg HPV DNA. 09/30/19 Results were normal, neg HPV DNA.  09/2016 Results were: low-grade squamous intraepithelial neoplasia (LGSIL - encompassing HPV,mild dysplasia,CIN I) /neg HPV DNA. Neg colpo bx with Dr. Georgianne Fick 1/18.  Also with LGSIL 2016 and neg colpo bx 2017.  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 There is no FH of ovarian cancer. The patient does self-breast exams.  Tobacco use: smokes 1 ppd, not ready to quit Alcohol use: few days weekly Daily marijuana use.  Exercise: min active  She does get adequate calcium but not Vitamin D in her diet.  Patient Active Problem List   Diagnosis Date Noted   LGSIL on Pap smear of cervix 11/17/2020   Acute anxiety 01/21/2016   Depression 01/21/2016    Past Surgical History:  Procedure Laterality Date   COLPOSCOPY  01/04/2006    Family History  Problem Relation Age of Onset   Anemia Mother    Hypertension Father    Ulcerative colitis Sister    Parkinson's disease Maternal Grandmother    Prostate cancer Maternal Grandfather     Social History   Socioeconomic History   Marital status: Single    Spouse name: Not on file   Number of children: Not on file   Years of education: Not on file   Highest education level: Not on file  Occupational History   Not on file  Tobacco Use   Smoking status: Every Day    Packs/day: 0.50    Types: Cigarettes   Smokeless tobacco: Never  Vaping Use    Vaping Use: Never used  Substance and Sexual Activity   Alcohol use: Yes    Alcohol/week: 0.0 standard drinks of alcohol   Drug use: Never   Sexual activity: Yes    Birth control/protection: Surgical    Comment: Vasectomy  Other Topics Concern   Not on file  Social History Narrative   Not on file   Social Determinants of Health   Financial Resource Strain: Not on file  Food Insecurity: Not on file  Transportation Needs: Not on file  Physical Activity: Not on file  Stress: Not on file  Social Connections: Not on file  Intimate Partner Violence: Not on file     Current Outpatient Medications:    valACYclovir (VALTREX) 500 MG tablet, TAKE ONE TABLET TWICE DAILY FOR 3 DAYS AS NEEDED FOR SYMPTOMS, Disp: 30 tablet, Rfl: 0   ROS:  Review of Systems  Constitutional:  Negative for fatigue, fever and unexpected weight change.  Respiratory:  Negative for cough, shortness of breath and wheezing.   Cardiovascular:  Negative for chest pain, palpitations and leg swelling.  Gastrointestinal:  Negative for blood in stool, constipation, diarrhea, nausea and vomiting.  Endocrine: Negative for cold intolerance, heat intolerance and polyuria.  Genitourinary:  Negative for dyspareunia, dysuria, flank pain, frequency, genital sores, hematuria, menstrual problem, pelvic pain, urgency, vaginal bleeding,  vaginal discharge and vaginal pain.  Musculoskeletal:  Negative for back pain, joint swelling and myalgias.  Skin:  Negative for rash.  Neurological:  Negative for dizziness, syncope, light-headedness, numbness and headaches.  Hematological:  Negative for adenopathy.  Psychiatric/Behavioral:  Negative for agitation, confusion, dysphoric mood, sleep disturbance and suicidal ideas. The patient is not nervous/anxious.   BREAST: No symptoms   Objective: There were no vitals taken for this visit.   Physical Exam Constitutional:      Appearance: She is well-developed.  Genitourinary:     Vulva  normal.     Right Labia: No rash, tenderness or lesions.    Left Labia: No tenderness, lesions or rash.    No vaginal discharge, erythema or tenderness.      Right Adnexa: not tender and no mass present.    Left Adnexa: not tender and no mass present.    No cervical friability or polyp.     Uterus is not enlarged or tender.  Breasts:    Right: No mass, nipple discharge, skin change or tenderness.     Left: No mass, nipple discharge, skin change or tenderness.  Neck:     Thyroid: No thyromegaly.  Cardiovascular:     Rate and Rhythm: Normal rate and regular rhythm.     Heart sounds: Normal heart sounds. No murmur heard. Pulmonary:     Effort: Pulmonary effort is normal.     Breath sounds: Normal breath sounds.  Abdominal:     Palpations: Abdomen is soft.     Tenderness: There is no abdominal tenderness. There is no guarding or rebound.  Musculoskeletal:        General: Normal range of motion.     Cervical back: Normal range of motion.  Lymphadenopathy:     Cervical: No cervical adenopathy.  Neurological:     General: No focal deficit present.     Mental Status: She is alert and oriented to person, place, and time.     Cranial Nerves: No cranial nerve deficit.  Skin:    General: Skin is warm and dry.  Psychiatric:        Mood and Affect: Mood normal.        Behavior: Behavior normal.        Thought Content: Thought content normal.        Judgment: Judgment normal.  Vitals reviewed.    Assessment/Plan: Encounter for annual routine gynecological examination  Cervical cancer screening - Plan: Cytology - PAP  Screening for HPV (human papillomavirus) - Plan: Cytology - PAP  LGSIL on Pap smear of cervix - Plan: Cytology - PAP; repeat pap today. Will f/u with results.   Encounter for surveillance of implantable subdermal contraceptive--pt will RTO for removal 3/23   GYN counsel adequate intake of calcium and vitamin D, diet and exercise; tobacco and marijuana  cessation     F/U  No follow-ups on file.  Aryani Daffern B. Dreama Kuna, PA-C 01/01/2023 5:08 PM

## 2023-01-02 ENCOUNTER — Other Ambulatory Visit (HOSPITAL_COMMUNITY)
Admission: RE | Admit: 2023-01-02 | Discharge: 2023-01-02 | Disposition: A | Discharge status: Home / Self Care | Payer: 59 | Source: Ambulatory Visit | Attending: Obstetrics and Gynecology | Admitting: Obstetrics and Gynecology

## 2023-01-02 ENCOUNTER — Encounter: Payer: Self-pay | Admitting: Obstetrics and Gynecology

## 2023-01-02 ENCOUNTER — Ambulatory Visit (INDEPENDENT_AMBULATORY_CARE_PROVIDER_SITE_OTHER): Payer: 59 | Admitting: Obstetrics and Gynecology

## 2023-01-02 VITALS — BP 140/90 | Ht 66.0 in | Wt 247.0 lb

## 2023-01-02 DIAGNOSIS — R87612 Low grade squamous intraepithelial lesion on cytologic smear of cervix (LGSIL): Secondary | ICD-10-CM | POA: Insufficient documentation

## 2023-01-02 DIAGNOSIS — Z124 Encounter for screening for malignant neoplasm of cervix: Secondary | ICD-10-CM | POA: Diagnosis not present

## 2023-01-02 DIAGNOSIS — Z01419 Encounter for gynecological examination (general) (routine) without abnormal findings: Secondary | ICD-10-CM | POA: Diagnosis not present

## 2023-01-02 DIAGNOSIS — A6004 Herpesviral vulvovaginitis: Secondary | ICD-10-CM

## 2023-01-02 DIAGNOSIS — Z1151 Encounter for screening for human papillomavirus (HPV): Secondary | ICD-10-CM

## 2023-01-02 MED ORDER — VALACYCLOVIR HCL 500 MG PO TABS: 500.0000 mg | ORAL_TABLET | Freq: Every day | ORAL | 3 refills | Status: DC

## 2023-01-02 NOTE — Patient Instructions (Signed)
I value your feedback and you entrusting us with your care. If you get a [facility] patient survey, I would appreciate you taking the time to let us know about your experience today. Thank you! ? ? ?

## 2023-01-08 LAB — CYTOLOGY - PAP
Adequacy: ABSENT
Comment: NEGATIVE
Diagnosis: NEGATIVE
High risk HPV: NEGATIVE

## 2023-02-01 ENCOUNTER — Encounter: Payer: Self-pay | Admitting: Obstetrics and Gynecology

## 2023-02-01 ENCOUNTER — Other Ambulatory Visit: Payer: Self-pay | Admitting: Obstetrics and Gynecology

## 2023-02-01 DIAGNOSIS — A6004 Herpesviral vulvovaginitis: Secondary | ICD-10-CM

## 2023-02-01 MED ORDER — VALACYCLOVIR HCL 1 G PO TABS: 1000.0000 mg | ORAL_TABLET | Freq: Every day | ORAL | 1 refills | Status: DC

## 2023-02-01 NOTE — Progress Notes (Signed)
Rx Valtrex dose increased to 1 g daily as preventive due to persistent vag burning sensation.

## 2023-02-01 NOTE — Progress Notes (Signed)
Established patient visit   Patient: Monique Mcmahon   DOB: 31-Jul-1986   37 y.o. Female  MRN: [id number] Visit Date: 02/02/2023  Today's healthcare provider: Jacky Kindle, FNP  Patient presents for new patient visit to establish care.  Introduced to Publishing rights manager role and practice setting.  All questions answered.  Discussed provider/patient relationship and expectations.  Chief Complaint  Patient presents with   Annual Exam   Subjective    HPI  Well Adult Physical: Patient here for a comprehensive physical exam.The patient reports no problems Do you take any herbs or supplements that were not prescribed by a doctor? yes Are you taking calcium supplements? yes Are you taking aspirin daily? no  Patient endorses possible concerns for AUD: noting that she does drink frequently and never has just one drink. Continue to monitor.  Medications: Outpatient Medications Prior to Visit  Medication Sig   CALCIUM PO Take by mouth.   Ferrous Sulfate (IRON PO) Take by mouth.   valACYclovir (VALTREX) 1000 MG tablet Take 1 tablet (1,000 mg total) by mouth daily.   VITAMIN D PO Take by mouth.   No facility-administered medications prior to visit.    Review of Systems  All other systems reviewed and are negative.      Objective    BP 126/86   Pulse 90   Ht 5\' 6"  (1.676 m)   Wt 249 lb (112.9 kg)   LMP 01/10/2023   BMI 40.19 kg/m    Physical Exam Vitals and nursing note reviewed.  Constitutional:      General: She is awake. She is not in acute distress.    Appearance: Normal appearance. She is well-developed and well-groomed. She is obese. She is not ill-appearing, toxic-appearing or diaphoretic.  HENT:     Head: Normocephalic and atraumatic.     Jaw: There is normal jaw occlusion. No trismus, tenderness, swelling or pain on movement.     Right Ear: Hearing, tympanic membrane, ear canal and external ear normal. There is no impacted cerumen.     Left Ear: Hearing,  tympanic membrane, ear canal and external ear normal. There is no impacted cerumen.     Nose: Nose normal. No congestion or rhinorrhea.     Right Turbinates: Not enlarged, swollen or pale.     Left Turbinates: Not enlarged, swollen or pale.     Right Sinus: No maxillary sinus tenderness or frontal sinus tenderness.     Left Sinus: No maxillary sinus tenderness or frontal sinus tenderness.     Mouth/Throat:     Lips: Pink.     Mouth: Mucous membranes are moist. No injury.     Tongue: No lesions.     Pharynx: Oropharynx is clear. Uvula midline. No pharyngeal swelling, oropharyngeal exudate, posterior oropharyngeal erythema or uvula swelling.     Tonsils: No tonsillar exudate or tonsillar abscesses.  Eyes:     General: Lids are normal. Lids are everted, no foreign bodies appreciated. Vision grossly intact. Gaze aligned appropriately. No allergic shiner or visual field deficit.       Right eye: No discharge.        Left eye: No discharge.     Extraocular Movements: Extraocular movements intact.     Conjunctiva/sclera: Conjunctivae normal.     Right eye: Right conjunctiva is not injected. No exudate.    Left eye: Left conjunctiva is not injected. No exudate.    Pupils: Pupils are equal, round, and reactive to light.  Neck:     Thyroid: No thyroid mass, thyromegaly or thyroid tenderness.     Vascular: No carotid bruit.     Trachea: Trachea normal.  Cardiovascular:     Rate and Rhythm: Normal rate and regular rhythm.     Pulses: Normal pulses.          Carotid pulses are 2+ on the right side and 2+ on the left side.      Radial pulses are 2+ on the right side and 2+ on the left side.       Dorsalis pedis pulses are 2+ on the right side and 2+ on the left side.       Posterior tibial pulses are 2+ on the right side and 2+ on the left side.     Heart sounds: Normal heart sounds, S1 normal and S2 normal. No murmur heard.    No friction rub. No gallop.  Pulmonary:     Effort: Pulmonary effort  is normal. No respiratory distress.     Breath sounds: Normal breath sounds and air entry. No stridor. No wheezing, rhonchi or rales.  Chest:     Chest wall: No tenderness.  Abdominal:     General: Abdomen is flat. Bowel sounds are normal. There is no distension.     Palpations: Abdomen is soft. There is no mass.     Tenderness: There is no abdominal tenderness. There is no right CVA tenderness, left CVA tenderness, guarding or rebound.     Hernia: No hernia is present.  Genitourinary:    Comments: Exam deferred; denies complaints Musculoskeletal:        General: No swelling, tenderness, deformity or signs of injury. Normal range of motion.     Cervical back: Full passive range of motion without pain, normal range of motion and neck supple. No edema, rigidity or tenderness. No muscular tenderness.     Right lower leg: No edema.     Left lower leg: No edema.  Lymphadenopathy:     Cervical: No cervical adenopathy.     Right cervical: No superficial, deep or posterior cervical adenopathy.    Left cervical: No superficial, deep or posterior cervical adenopathy.  Skin:    General: Skin is warm and dry.     Capillary Refill: Capillary refill takes less than 2 seconds.     Coloration: Skin is not jaundiced or pale.     Findings: No bruising, erythema, lesion or rash.  Neurological:     General: No focal deficit present.     Mental Status: She is alert and oriented to person, place, and time. Mental status is at baseline.     GCS: GCS eye subscore is 4. GCS verbal subscore is 5. GCS motor subscore is 6.     Sensory: Sensation is intact. No sensory deficit.     Motor: Motor function is intact. No weakness.     Coordination: Coordination is intact. Coordination normal.     Gait: Gait is intact. Gait normal.  Psychiatric:        Attention and Perception: Attention and perception normal.        Mood and Affect: Mood and affect normal.        Speech: Speech normal.        Behavior: Behavior  normal. Behavior is cooperative.        Thought Content: Thought content normal.        Cognition and Memory: Cognition and memory normal.  Judgment: Judgment normal.     No results found for any visits on 02/02/23.  Assessment & Plan     Problem List Items Addressed This Visit       Other   Encounter for hepatitis C screening test for low risk patient    Low risk screen Treatable, and curable. If left untreated Hep C can lead to cirrhosis and liver failure. Encourage routine testing; recommend repeat testing if risk factors change.       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  - Eat a low-fat diet with lots of fruits and vegetables, up to 7-9 servings per day.  - Seatbelts can save your life. Wear them always.  - Smoke detectors on every level of your home, check batteries every year.  - Eye Doctor - have an eye exam every 1-2 years  - Safe sex - if you may be exposed to STDs, use a condom.  - Alcohol -  If you drink, do it moderately, less than 2 drinks per day.  - Health Care Power of Attorney. Choose someone to speak for you if you are not able.  - Depression is common in our stressful world.If you're feeling down or losing interest in things you normally enjoy, please come in for a visit.  - Violence - If anyone is threatening or hurting you, please call immediately.       Relevant Orders   CBC with Differential/Platelet   Comprehensive Metabolic Panel (CMET)   Lipid panel   Hemoglobin A1c   TSH + free T4   Encounter for screening for HIV    Low risk screen Consented; encouraged to "know your status" Recommend repeat screen if risk factors change       Relevant  Orders   HIV antibody (with reflex)   History of depression    Denies current concerns; previously started on effexor       Obesity, morbid, BMI 40.0-49.9    Chronic, Body mass index is 40.19 kg/m. Continue to recommend balanced, lower carb meals. Smaller meal size, adding snacks. Choosing water as drink of choice and increasing purposeful exercise.       Relevant Orders   CBC with Differential/Platelet   Comprehensive Metabolic Panel (CMET)   Lipid panel   Hemoglobin A1c   TSH + free T4   Tobacco use disorder    Chronic, appears to worsen per chart review Reports 1 ppd; declines cessation efforts at this time       Return in about 1 year (around 02/02/2024), or if symptoms worsen or fail to improve.     Leilani Merl, FNP, have reviewed all documentation for this visit. The documentation on 02/02/23 for the exam, diagnosis, procedures, and orders are all accurate and complete.  Jacky Kindle, FNP  [facility] [phone] (phone) [phone] (fax)  [facility]

## 2023-02-02 ENCOUNTER — Ambulatory Visit (INDEPENDENT_AMBULATORY_CARE_PROVIDER_SITE_OTHER): Payer: 59 | Admitting: Family Medicine

## 2023-02-02 ENCOUNTER — Encounter: Payer: Self-pay | Admitting: Family Medicine

## 2023-02-02 VITALS — BP 126/86 | HR 90 | Ht 66.0 in | Wt 249.0 lb

## 2023-02-02 DIAGNOSIS — Z114 Encounter for screening for human immunodeficiency virus [HIV]: Secondary | ICD-10-CM | POA: Diagnosis not present

## 2023-02-02 DIAGNOSIS — Z0000 Encounter for general adult medical examination without abnormal findings: Secondary | ICD-10-CM | POA: Diagnosis not present

## 2023-02-02 DIAGNOSIS — Z8659 Personal history of other mental and behavioral disorders: Secondary | ICD-10-CM

## 2023-02-02 DIAGNOSIS — Z1159 Encounter for screening for other viral diseases: Secondary | ICD-10-CM | POA: Diagnosis not present

## 2023-02-02 DIAGNOSIS — F17200 Nicotine dependence, unspecified, uncomplicated: Secondary | ICD-10-CM | POA: Diagnosis not present

## 2023-02-02 NOTE — Assessment & Plan Note (Signed)
Low risk screen Treatable, and curable. If left untreated Hep C can lead to cirrhosis and liver failure. Encourage routine testing; recommend repeat testing if risk factors change.  

## 2023-02-02 NOTE — Assessment & Plan Note (Signed)
Low risk screen ?Consented; encouraged to "know your status" ?Recommend repeat screen if risk factors change ? ?

## 2023-02-02 NOTE — Assessment & Plan Note (Signed)
Chronic, appears to worsen per chart review Reports 1 ppd; declines cessation efforts at this time

## 2023-02-02 NOTE — Assessment & Plan Note (Signed)
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  - Eat a low-fat diet with lots of fruits and vegetables, up to 7-9 servings per day.  - Seatbelts can save your life. Wear them always.  - Smoke detectors on every level of your home, check batteries every year.  - Eye Doctor - have an eye exam every 1-2 years  - Safe sex - if you may be exposed to STDs, use a condom.  - Alcohol -  If you drink, do it moderately, less than 2 drinks per day.  - Health Care Power of Attorney. Choose someone to speak for you if you are not able.  - Depression is common in our stressful world.If you're feeling down or losing interest in things you normally enjoy, please come in for a visit.  - Violence - If anyone is threatening or hurting you, please call immediately.

## 2023-02-02 NOTE — Assessment & Plan Note (Signed)
Chronic, Body mass index is 40.19 kg/m. Continue to recommend balanced, lower carb meals. Smaller meal size, adding snacks. Choosing water as drink of choice and increasing purposeful exercise.

## 2023-02-02 NOTE — Assessment & Plan Note (Signed)
Denies current concerns; previously started on effexor

## 2023-02-03 LAB — CBC WITH DIFFERENTIAL/PLATELET
Basophils Absolute: 0.1 10*3/uL (ref 0.0–0.2)
Basos: 1 %
EOS (ABSOLUTE): 0.2 10*3/uL (ref 0.0–0.4)
Eos: 1 %
Hematocrit: 44.6 % (ref 34.0–46.6)
Hemoglobin: 15.2 g/dL (ref 11.1–15.9)
Immature Grans (Abs): 0 10*3/uL (ref 0.0–0.1)
Immature Granulocytes: 0 %
Lymphocytes Absolute: 3.7 10*3/uL — ABNORMAL HIGH (ref 0.7–3.1)
Lymphs: 31 %
MCH: 31.1 pg (ref 26.6–33.0)
MCHC: 34.1 g/dL (ref 31.5–35.7)
MCV: 91 fL (ref 79–97)
Monocytes Absolute: 0.6 10*3/uL (ref 0.1–0.9)
Monocytes: 5 %
Neutrophils Absolute: 7.4 10*3/uL — ABNORMAL HIGH (ref 1.4–7.0)
Neutrophils: 62 %
Platelets: 314 10*3/uL (ref 150–450)
RBC: 4.89 x10E6/uL (ref 3.77–5.28)
RDW: 12.8 % (ref 11.7–15.4)
WBC: 11.9 10*3/uL — ABNORMAL HIGH (ref 3.4–10.8)

## 2023-02-03 LAB — LIPID PANEL
Chol/HDL Ratio: 3.8 ratio (ref 0.0–4.4)
Cholesterol, Total: 273 mg/dL — ABNORMAL HIGH (ref 100–199)
HDL: 72 mg/dL (ref >39)
LDL Chol Calc (NIH): 164 mg/dL — ABNORMAL HIGH (ref 0–99)
Triglycerides: 202 mg/dL — ABNORMAL HIGH (ref 0–149)
VLDL Cholesterol Cal: 37 mg/dL (ref 5–40)

## 2023-02-03 LAB — COMPREHENSIVE METABOLIC PANEL
ALT: 25 IU/L (ref 0–32)
AST: 19 IU/L (ref 0–40)
Albumin/Globulin Ratio: 1.7 (ref 1.2–2.2)
Albumin: 4.7 g/dL (ref 3.9–4.9)
Alkaline Phosphatase: 56 IU/L (ref 44–121)
BUN/Creatinine Ratio: 16 (ref 9–23)
BUN: 14 mg/dL (ref 6–20)
Bilirubin Total: 0.2 mg/dL (ref 0.0–1.2)
CO2: 23 mmol/L (ref 20–29)
Calcium: 10.2 mg/dL (ref 8.7–10.2)
Chloride: 100 mmol/L (ref 96–106)
Creatinine, Ser: 0.86 mg/dL (ref 0.57–1.00)
Globulin, Total: 2.7 g/dL (ref 1.5–4.5)
Glucose: 118 mg/dL — ABNORMAL HIGH (ref 70–99)
Potassium: 4.2 mmol/L (ref 3.5–5.2)
Sodium: 138 mmol/L (ref 134–144)
Total Protein: 7.4 g/dL (ref 6.0–8.5)
eGFR: 89 mL/min/{1.73_m2} (ref >59)

## 2023-02-03 LAB — TSH+FREE T4
Free T4: 1.41 ng/dL (ref 0.82–1.77)
TSH: 2.14 u[IU]/mL (ref 0.450–4.500)

## 2023-02-03 LAB — HEPATITIS C ANTIBODY: Hep C Virus Ab: NONREACTIVE

## 2023-02-03 LAB — HIV ANTIBODY (ROUTINE TESTING W REFLEX): HIV Screen 4th Generation wRfx: NONREACTIVE

## 2023-02-03 LAB — HEMOGLOBIN A1C
Est. average glucose Bld gHb Est-mCnc: 105 mg/dL
Hgb A1c MFr Bld: 5.3 % (ref 4.8–5.6)

## 2023-02-04 NOTE — Progress Notes (Signed)
Cholesterol was markedly elevated with LDL at 164 [goal <100] and total at 273 [goal <199.] We know that tobacco use as well as diet can be the leading cause of either heart attack and/or stroke. I recommend diet low in saturated fat and regular exercise - 30 min at least 5 times per week  All other labs are stable/normal on review. Slight inflammatory processed noted on cell count.

## 2023-04-18 ENCOUNTER — Encounter: Payer: 59 | Admitting: Dermatology

## 2023-06-12 ENCOUNTER — Ambulatory Visit (INDEPENDENT_AMBULATORY_CARE_PROVIDER_SITE_OTHER): Payer: 59 | Admitting: Dermatology

## 2023-06-12 ENCOUNTER — Encounter: Payer: Self-pay | Admitting: Dermatology

## 2023-06-12 DIAGNOSIS — Z808 Family history of malignant neoplasm of other organs or systems: Secondary | ICD-10-CM

## 2023-06-12 DIAGNOSIS — L814 Other melanin hyperpigmentation: Secondary | ICD-10-CM | POA: Diagnosis not present

## 2023-06-12 DIAGNOSIS — Z1283 Encounter for screening for malignant neoplasm of skin: Secondary | ICD-10-CM

## 2023-06-12 DIAGNOSIS — D229 Melanocytic nevi, unspecified: Secondary | ICD-10-CM | POA: Diagnosis not present

## 2023-06-12 DIAGNOSIS — L578 Other skin changes due to chronic exposure to nonionizing radiation: Secondary | ICD-10-CM

## 2023-06-12 DIAGNOSIS — W908XXA Exposure to other nonionizing radiation, initial encounter: Secondary | ICD-10-CM

## 2023-06-12 NOTE — Progress Notes (Signed)
   Follow-Up Visit   Subjective  Monique Mcmahon is a 37 y.o. female who presents for the following: Skin Cancer Screening and Full Body Skin Exam. No personal hx of skin cancer or dysplastic nevus. Sister Dx with MIS last year at age 83. Mother has had BCC.   The patient presents for Total-Body Skin Exam (TBSE) for skin cancer screening and mole check. The patient has spots, moles and lesions to be evaluated, some may be new or changing and the patient may have concern these could be cancer.    The following portions of the chart were reviewed this encounter and updated as appropriate: medications, allergies, medical history  Review of Systems:  No other skin or systemic complaints except as noted in HPI or Assessment and Plan.  Objective  Well appearing patient in no apparent distress; mood and affect are within normal limits.  A full examination was performed including scalp, head, eyes, ears, nose, lips, neck, chest, axillae, abdomen, back, buttocks, bilateral upper extremities, bilateral lower extremities, hands, feet, fingers, toes, fingernails, and toenails. All findings within normal limits unless otherwise noted below.   Relevant physical exam findings are noted in the Assessment and Plan.    Assessment & Plan   FAMILY HISTORY OF SKIN CANCER What type(s): MM, BCC Who affected: Sister, Mother   SKIN CANCER SCREENING PERFORMED TODAY.  ACTINIC DAMAGE - Chronic condition, secondary to cumulative UV/sun exposure - diffuse scaly erythematous macules with underlying dyspigmentation - Recommend daily broad spectrum sunscreen SPF 30+ to sun-exposed areas, reapply every 2 hours as needed.  - Staying in the shade or wearing long sleeves, sun glasses (UVA+UVB protection) and wide brim hats (4-inch brim around the entire circumference of the hat) are also recommended for sun protection.  - Call for new or changing lesions.  LENTIGINES- Benign normal skin lesions -  Benign-appearing - Call for any changes  MELANOCYTIC NEVI - Tan-brown and/or pink-flesh-colored symmetric macules and papules - Benign appearing on exam today - Observation - Call clinic for new or changing moles - Recommend daily use of broad spectrum spf 30+ sunscreen to sun-exposed areas.    Return in 1 year (on 06/11/2024) for TBSE.  I, Lawson Radar, CMA, am acting as scribe for Elie Goody, MD.   Documentation: I have reviewed the above documentation for accuracy and completeness, and I agree with the above.  Elie Goody, MD

## 2023-06-12 NOTE — Patient Instructions (Signed)
 Recommend daily broad spectrum sunscreen SPF 30+ to sun-exposed areas, reapply every 2 hours as needed. Call for new or changing lesions.  Staying in the shade or wearing long sleeves, sun glasses (UVA+UVB protection) and wide brim hats (4-inch brim around the entire circumference of the hat) are also recommended for sun protection.    Melanoma ABCDEs  Melanoma is the most dangerous type of skin cancer, and is the leading cause of death from skin disease.  You are more likely to develop melanoma if you: Have light-colored skin, light-colored eyes, or red or blond hair Spend a lot of time in the sun Tan regularly, either outdoors or in a tanning bed Have had blistering sunburns, especially during childhood Have a close family member who has had a melanoma Have atypical moles or large birthmarks  Early detection of melanoma is key since treatment is typically straightforward and cure rates are extremely high if we catch it early.   The first sign of melanoma is often a change in a mole or a new dark spot.  The ABCDE system is a way of remembering the signs of melanoma.  A for asymmetry:  The two halves do not match. B for border:  The edges of the growth are irregular. C for color:  A mixture of colors are present instead of an even brown color. D for diameter:  Melanomas are usually (but not always) greater than 6mm - the size of a pencil eraser. E for evolution:  The spot keeps changing in size, shape, and color.  Please check your skin once per month between visits. You can use a small mirror in front and a large mirror behind you to keep an eye on the back side or your body.   If you see any new or changing lesions before your next follow-up, please call to schedule a visit.  Please continue daily skin protection including broad spectrum sunscreen SPF 30+ to sun-exposed areas, reapplying every 2 hours as needed when you're outdoors.   Staying in the shade or wearing long sleeves, sun  glasses (UVA+UVB protection) and wide brim hats (4-inch brim around the entire circumference of the hat) are also recommended for sun protection.    Due to recent changes in healthcare laws, you may see results of your pathology and/or laboratory studies on MyChart before the doctors have had a chance to review them. We understand that in some cases there may be results that are confusing or concerning to you. Please understand that not all results are received at the same time and often the doctors may need to interpret multiple results in order to provide you with the best plan of care or course of treatment. Therefore, we ask that you please give [place] 2 business days to thoroughly review all your results before contacting the office for clarification. Should we see a critical lab result, you will be contacted sooner.   If You Need Anything After Your Visit  If you have any questions or concerns for your doctor, please call our main line at [phone] and press option 4 to reach your doctor's medical assistant. If no one answers, please leave a voicemail as directed and we will return your call as soon as possible. Messages left after 4 pm will be answered the following business day.   You may also send [place] a message via MyChart. We typically respond to MyChart messages within 1-2 business days.  For prescription refills, please ask your pharmacy to contact our  office. Our fax number is [phone].  If you have an urgent issue when the clinic is closed that cannot wait until the next business day, you can page your doctor at the number below.    Please note that while we do our best to be available for urgent issues outside of office hours, we are not available 24/7.   If you have an urgent issue and are unable to reach [place], you may choose to seek medical care at your doctor's office, retail clinic, urgent care center, or emergency room.  If you have a medical emergency, please immediately call  911 or go to the emergency department.  Pager Numbers  - Dr. Gwen Pounds: [phone]  - Dr. Roseanne Reno: [phone]  - Dr. Katrinka Blazing: [phone]   In the event of inclement weather, please call our main line at [phone] for an update on the status of any delays or closures.  Dermatology Medication Tips: Please keep the boxes that topical medications come in in order to help keep track of the instructions about where and how to use these. Pharmacies typically print the medication instructions only on the boxes and not directly on the medication tubes.   If your medication is too expensive, please contact our office at [phone] option 4 or send [place] a message through MyChart.   We are unable to tell what your co-pay for medications will be in advance as this is different depending on your insurance coverage. However, we may be able to find a substitute medication at lower cost or fill out paperwork to get insurance to cover a needed medication.   If a prior authorization is required to get your medication covered by your insurance company, please allow [place] 1-2 business days to complete this process.  Drug prices often vary depending on where the prescription is filled and some pharmacies may offer cheaper prices.  The website www.goodrx.com contains coupons for medications through different pharmacies. The prices here do not account for what the cost may be with help from insurance (it may be cheaper with your insurance), but the website can give you the price if you did not use any insurance.  - You can print the associated coupon and take it with your prescription to the pharmacy.  - You may also stop by our office during regular business hours and pick up a GoodRx coupon card.  - If you need your prescription sent electronically to a different pharmacy, notify our office through [facility] or by phone at [phone] option 4.     Si Usted Necesita Algo Despus de Su  Visita  Tambin puede enviarnos un mensaje a travs de Clinical cytogeneticist. Por lo general respondemos a los mensajes de MyChart en el transcurso de 1 a 2 das hbiles.  Para renovar recetas, por favor pida a su farmacia que se ponga en contacto con nuestra oficina. Annie Sable de fax es [place] [phone].  Si tiene un asunto urgente cuando la clnica est cerrada y que no puede esperar hasta el siguiente da hbil, puede llamar/localizar a su doctor(a) al nmero que aparece a continuacin.   Por favor, tenga en cuenta que aunque hacemos todo lo posible para estar disponibles para asuntos urgentes fuera del horario de [place], no estamos disponibles las 24 horas del da, los 7 [address]  [address] de la [place].   Si tiene un problema urgente y no puede comunicarse con nosotros, puede optar por buscar atencin mdica  en el consultorio de su doctor(a), en una clnica privada,  en un centro de atencin urgente o en una sala de emergencias.  Si tiene Engineer, drilling, por favor llame inmediatamente al 911 o vaya a la sala de emergencias.  Nmeros de bper  - Dr. Gwen Pounds: [phone]  - Dra. Roseanne Reno: [phone]  - Dr. Katrinka Blazing: [phone]   En caso de inclemencias del tiempo, por favor llame a Lacy Duverney principal al [phone] para una actualizacin sobre el [place] de cualquier retraso o cierre.  Consejos para la medicacin en dermatologa: Por favor, guarde las cajas en las que vienen los medicamentos de uso tpico para ayudarle a seguir las instrucciones sobre dnde y cmo usarlos. Las farmacias generalmente imprimen las instrucciones del medicamento slo en las cajas y no directamente en los tubos del [place].   Si su medicamento es muy caro, por favor, pngase en contacto con Rolm Gala llamando al [phone] y presione la opcin 4 o envenos un mensaje a travs de Clinical cytogeneticist.   No podemos decirle cul ser su copago por los medicamentos por adelantado ya que esto es diferente dependiendo de  la cobertura de su seguro. Sin embargo, es posible que podamos encontrar un medicamento sustituto a Audiological scientist un formulario para que el seguro cubra el medicamento que se considera necesario.   Si se requiere una autorizacin previa para que su compaa de seguros [place] su medicamento, por favor permtanos de 1 a 2 das hbiles para completar [address]  Los precios de los medicamentos varan con frecuencia dependiendo del Environmental consultant de dnde se surte la receta y alguna farmacias pueden ofrecer precios ms baratos.  El sitio web www.goodrx.com tiene cupones para medicamentos de Health and safety inspector. Los precios aqu no tienen en cuenta lo que podra costar con la ayuda del seguro (puede ser ms barato con su seguro), pero el sitio web puede darle el precio si no utiliz Tourist information centre manager.  - Puede imprimir el cupn correspondiente y llevarlo con su receta a la farmacia.  - Tambin puede pasar por nuestra oficina durante el horario de atencin regular y Education officer, museum una tarjeta de cupones de GoodRx.  - Si necesita que su receta se enve electrnicamente a una farmacia diferente, informe a nuestra oficina a travs de MyChart de [place] o por telfono llamando al [phone] y presione la opcin 4.

## 2024-02-04 ENCOUNTER — Encounter: Payer: 59 | Admitting: Family Medicine

## 2024-02-05 ENCOUNTER — Encounter: Payer: Self-pay | Admitting: Family Medicine

## 2024-02-14 ENCOUNTER — Ambulatory Visit (INDEPENDENT_AMBULATORY_CARE_PROVIDER_SITE_OTHER): Payer: 59 | Admitting: Family Medicine

## 2024-02-14 ENCOUNTER — Telehealth: Payer: Self-pay

## 2024-02-14 ENCOUNTER — Other Ambulatory Visit (HOSPITAL_COMMUNITY): Payer: Self-pay

## 2024-02-14 ENCOUNTER — Encounter: Payer: Self-pay | Admitting: Family Medicine

## 2024-02-14 VITALS — BP 129/89 | HR 93 | Ht 66.0 in | Wt 247.0 lb

## 2024-02-14 DIAGNOSIS — L309 Dermatitis, unspecified: Secondary | ICD-10-CM

## 2024-02-14 DIAGNOSIS — Z0000 Encounter for general adult medical examination without abnormal findings: Secondary | ICD-10-CM | POA: Insufficient documentation

## 2024-02-14 DIAGNOSIS — F17200 Nicotine dependence, unspecified, uncomplicated: Secondary | ICD-10-CM

## 2024-02-14 DIAGNOSIS — Z13220 Encounter for screening for lipoid disorders: Secondary | ICD-10-CM

## 2024-02-14 DIAGNOSIS — Z23 Encounter for immunization: Secondary | ICD-10-CM

## 2024-02-14 DIAGNOSIS — Z0001 Encounter for general adult medical examination with abnormal findings: Secondary | ICD-10-CM | POA: Diagnosis not present

## 2024-02-14 DIAGNOSIS — Z131 Encounter for screening for diabetes mellitus: Secondary | ICD-10-CM

## 2024-02-14 DIAGNOSIS — Z130 Encounter for screening for diseases of the blood and blood-forming organs and certain disorders involving the immune mechanism: Secondary | ICD-10-CM

## 2024-02-14 MED ORDER — CLOBETASOL PROPIONATE 0.05 % EX FOAM
Freq: Two times a day (BID) | CUTANEOUS | 0 refills | Status: DC
Start: 2024-02-14 — End: 2024-02-14

## 2024-02-14 MED ORDER — CLOBETASOL PROPIONATE 0.05 % EX GEL: 1.0000 | Freq: Two times a day (BID) | CUTANEOUS | 1 refills | Status: AC

## 2024-02-14 NOTE — Telephone Encounter (Signed)
 Pharmacy Patient Advocate Encounter   Received notification from Onbase that prior authorization for Clobetasol  Propionate 0.05% foam is required/requested.   Insurance verification completed.   The patient is insured through [facility] .   Per test claim:  Betamethasone augmented 0.05% gel, Clobetasol  0.05% gel or Clobetasol  0.05% solution  is preferred by the insurance.  If suggested medication is appropriate, Please send in a new RX and discontinue this one. If not, please advise as to why it's not appropriate so that we may request a Prior Authorization. Please note, some preferred medications may still require a PA.  If the suggested medications have not been trialed and there are no contraindications to their use, the PA will not be submitted, as it will not be approved.

## 2024-02-14 NOTE — Assessment & Plan Note (Signed)
 She smokes 1.5 packs per day. Discussed risks associated with smoking, including increased risk of lung disease and invasive pneumonia. Encouraged reduction in smoking to half a pack per day. Discussed pneumococcal vaccine due to smoking history. - Administer pneumococcal vaccine - Encourage reduction in smoking to half a pack per day

## 2024-02-14 NOTE — Assessment & Plan Note (Signed)
 Annual physical examination conducted. She reports minimal exercise and irregular eating patterns, primarily consuming coffee in the morning, a snack during the day, and dinner. Encouraged a balanced diet and regular exercise, aiming for 150 minutes of activity per week. Labs ordered for diabetes screening (A1c), CBC for anemia, metabolic panel for liver, kidneys, and electrolytes, and cholesterol check. Discussed routine screenings for HIV and hepatitis as part of standard care. - Order A1c, CBC, metabolic panel, and cholesterol check (phlebotomist is not available for the morning so patient will return for labs 02/15/24) - Encourage balanced diet and regular exercise, aiming for 150 minutes per week - patient received tdap today

## 2024-02-14 NOTE — Telephone Encounter (Signed)
 Please see the message from Dr R below

## 2024-02-14 NOTE — Assessment & Plan Note (Signed)
 Chronic Recurrent skin rash, exacerbated by travel abroad. Current rash located on arm. Previous treatments include Benadryl, prednisone , cortisone injection, and triamcinolone. Clobetasol  has been used in the past. She reports prednisone  causes irritability. Plan to use clobetasol  twice daily for up to two weeks, stopping earlier if rash clears. - Prescribe clobetasol  twice daily for up to two weeks, discontinue if rash clears earlier

## 2024-02-14 NOTE — Telephone Encounter (Signed)
 Please see the message below.

## 2024-02-14 NOTE — Telephone Encounter (Signed)
 error

## 2024-02-14 NOTE — Addendum Note (Signed)
 Addended by: Bart Lieu on: 02/14/2024 11:50 AM   Modules accepted: Orders

## 2024-02-14 NOTE — Telephone Encounter (Signed)
 Original RX not covered by insurance, submitted clobetasol  0.05% gel as replacement for accepted alternative

## 2024-02-14 NOTE — Progress Notes (Signed)
 Complete physical exam   Patient: Monique Mcmahon   DOB: 07/01/1986   38 y.o. Female  MRN: [id number] Visit Date: 02/14/2024  Today's healthcare provider: Mimi Alt, MD   Chief Complaint  Patient presents with   Annual Exam    No main concerns   Subjective    Monique Mcmahon is a 38 y.o. female who presents today for a complete physical exam.   She reports consuming a general diet.   The patient does not participate in regular exercise at present.    She does not have additional problems to discuss today.   Discussed the use of AI scribe software for clinical note transcription with the patient, who gave verbal consent to proceed.  History of Present Illness Monique Mcmahon is a 38 year old female who presents for an annual physical exam.  She has a non-existent exercise routine and a diet that lacks attention. Her typical diet includes coffee in the morning, a snack during the day, and dinner in the evening, which she attributes to a busy work schedule.  She smokes a pack and a half of cigarettes per day.   Recurrent rash  She experiences recurrent skin rashes, particularly after traveling abroad. She recently returned from [place] and developed a rash on her skin, with a patch on her arm and some spots spreading. Previous treatments included Benadryl, prednisone , cortisone injections, and triamcinolone, which provided no relief. Clobetasol  and Olux  were effective in the past. She has a strong aversion to prednisone  due to its side effects, describing it as making her irritable.     Past Medical History:  Diagnosis Date   Generalized anxiety disorder    Herpes genitalis    HSV 2 pos by IgG   Major depression    Pap smear abnormality of cervix with LGSIL 2017   Pap smear abnormality of vagina with ASC-US  2016   Past Surgical History:  Procedure Laterality Date   COLPOSCOPY  01/04/2006   Social History   Socioeconomic History   Marital  status: Single    Spouse name: Not on file   Number of children: Not on file   Years of education: Not on file   Highest education level: Some college, no degree  Occupational History   Not on file  Tobacco Use   Smoking status: Every Day    Current packs/day: 1.00    Types: Cigarettes   Smokeless tobacco: Never  Vaping Use   Vaping status: Never Used  Substance and Sexual Activity   Alcohol use: Yes    Alcohol/week: 0.0 standard drinks of alcohol   Drug use: Never   Sexual activity: Yes    Birth control/protection: Surgical    Comment: Vasectomy  Other Topics Concern   Not on file  Social History Narrative   Not on file   Social Drivers of Health   Financial Resource Strain: Low Risk  (02/13/2024)   Overall Financial Resource Strain (CARDIA)    Difficulty of Paying Living Expenses: Not very hard  Food Insecurity: No Food Insecurity (02/13/2024)   Hunger Vital Sign    Worried About Running Out of Food in the Last Year: Never true    Ran Out of Food in the Last Year: Never true  Transportation Needs: No Transportation Needs (02/13/2024)   PRAPARE - Administrator, Civil Service (Medical): No    Lack of Transportation (Non-Medical): No  Physical Activity: Insufficiently Active (02/13/2024)   Exercise Vital Sign  Days of Exercise per Week: 1 day    Minutes of Exercise per Session: 20 min  Stress: No Stress Concern Present (02/13/2024)   [employer] of Occupational Health - Occupational Stress Questionnaire    Feeling of Stress : Only a little  Social Connections: Moderately Isolated (02/13/2024)   Social Connection and Isolation Panel [NHANES]    Frequency of Communication with Friends and Family: Twice a week    Frequency of Social Gatherings with Friends and Family: Once a week    Attends Religious Services: Never    Database administrator or Organizations: No    Attends Engineer, structural: Not on file    Marital Status: Married  Careers information officer Violence: Not At Risk (02/14/2024)   Humiliation, Afraid, Rape, and Kick questionnaire    Fear of Current or Ex-Partner: No    Emotionally Abused: No    Physically Abused: No    Sexually Abused: No   Family Status  Relation Name Status   Mother  Alive   Father  Alive   Sister  Alive   Sister  Alive   MGM  Alive   MGF  Alive  No partnership data on file   Family History  Problem Relation Age of Onset   Anemia Mother    Hypertension Father    Ulcerative colitis Sister    Melanoma Sister    Parkinson's disease Maternal Grandmother    Prostate cancer Maternal Grandfather    No Known Allergies   Medications: Outpatient Medications Prior to Visit  Medication Sig   CALCIUM PO Take by mouth.   Ferrous Sulfate (IRON PO) Take by mouth.   valACYclovir  (VALTREX ) 1000 MG tablet Take 1 tablet (1,000 mg total) by mouth daily.   VITAMIN D PO Take by mouth.   No facility-administered medications prior to visit.    Review of Systems  Last CBC Lab Results  Component Value Date   WBC 11.9 (H) 02/02/2023   HGB 15.2 02/02/2023   HCT 44.6 02/02/2023   MCV 91 02/02/2023   MCH 31.1 02/02/2023   RDW 12.8 02/02/2023   PLT 314 02/02/2023   Last metabolic panel Lab Results  Component Value Date   GLUCOSE 118 (H) 02/02/2023   NA 138 02/02/2023   K 4.2 02/02/2023   CL 100 02/02/2023   CO2 23 02/02/2023   BUN 14 02/02/2023   CREATININE 0.86 02/02/2023   EGFR 89 02/02/2023   CALCIUM 10.2 02/02/2023   PROT 7.4 02/02/2023   ALBUMIN 4.7 02/02/2023   LABGLOB 2.7 02/02/2023   AGRATIO 1.7 02/02/2023   BILITOT 0.2 02/02/2023   ALKPHOS 56 02/02/2023   AST 19 02/02/2023   ALT 25 02/02/2023   Last lipids Lab Results  Component Value Date   CHOL 273 (H) 02/02/2023   HDL 72 02/02/2023   LDLCALC 164 (H) 02/02/2023   TRIG 202 (H) 02/02/2023   CHOLHDL 3.8 02/02/2023   Last hemoglobin A1c Lab Results  Component Value Date   HGBA1C 5.3 02/02/2023   Last thyroid  functions Lab Results  Component Value Date   TSH 2.140 02/02/2023   Last vitamin D No results found for: "25OHVITD2", "25OHVITD3", "VD25OH" Last vitamin B12 and Folate No results found for: "VITAMINB12", "FOLATE"     Objective    BP 129/89   Pulse 93   Ht 5\' 6"  (1.676 m)   Wt 247 lb (112 kg)   SpO2 99%   BMI 39.87 kg/m  BP Readings from  Last 3 Encounters:  02/14/24 129/89  02/02/23 126/86  01/02/23 (!) 140/90   Wt Readings from Last 3 Encounters:  02/14/24 247 lb (112 kg)  02/02/23 249 lb (112.9 kg)  01/02/23 247 lb (112 kg)        Physical Exam Vitals reviewed.  Constitutional:      General: She is not in acute distress.    Appearance: Normal appearance. She is not ill-appearing, toxic-appearing or diaphoretic.  HENT:     Head: Normocephalic and atraumatic.     Right Ear: Tympanic membrane and external ear normal. There is no impacted cerumen.     Left Ear: Tympanic membrane and external ear normal. There is no impacted cerumen.     Nose: Nose normal.     Mouth/Throat:     Pharynx: Oropharynx is clear.  Eyes:     General: No scleral icterus.    Extraocular Movements: Extraocular movements intact.     Conjunctiva/sclera: Conjunctivae normal.     Pupils: Pupils are equal, round, and reactive to light.  Cardiovascular:     Rate and Rhythm: Normal rate and regular rhythm.     Pulses: Normal pulses.     Heart sounds: Normal heart sounds. No murmur heard.    No friction rub. No gallop.  Pulmonary:     Effort: Pulmonary effort is normal. No respiratory distress.     Breath sounds: Normal breath sounds. No wheezing, rhonchi or rales.  Abdominal:     General: Bowel sounds are normal. There is no distension.     Palpations: Abdomen is soft. There is no mass.     Tenderness: There is no abdominal tenderness. There is no guarding.  Musculoskeletal:        General: No deformity.     Cervical back: Normal range of motion and neck supple.     Right lower leg: No  edema.     Left lower leg: No edema.  Lymphadenopathy:     Cervical: No cervical adenopathy.  Skin:    General: Skin is warm.     Capillary Refill: Capillary refill takes less than 2 seconds.     Findings: Rash present. No erythema.       Neurological:     General: No focal deficit present.     Mental Status: She is alert and oriented to person, place, and time.     Cranial Nerves: Cranial nerves 2-12 are intact. No cranial nerve deficit or facial asymmetry.     Motor: Motor function is intact. No weakness.     Gait: Gait normal.  Psychiatric:        Mood and Affect: Mood normal.        Behavior: Behavior normal.       Last depression screening scores    02/14/2024    8:12 AM 02/02/2023    3:04 PM  PHQ 2/9 Scores  PHQ - 2 Score 0 0  PHQ- 9 Score 8 0    Last fall risk screening    02/02/2023    3:05 PM  Fall Risk   Falls in the past year? 0    Last Audit-C alcohol use screening    02/13/2024    1:56 PM  Alcohol Use Disorder Test (AUDIT)  1. How often do you have a drink containing alcohol? 4  2. How many drinks containing alcohol do you have on a typical day when you are drinking? 2  3. How often do you have six or more drinks  on one occasion? 3  AUDIT-C Score 9   4. How often during the last year have you found that you were not able to stop drinking once you had started? 2  5. How often during the last year have you failed to do what was normally expected from you because of drinking? 0  6. How often during the last year have you needed a first drink in the morning to get yourself going after a heavy drinking session? 0  7. How often during the last year have you had a feeling of guilt of remorse after drinking? 0  8. How often during the last year have you been unable to remember what happened the night before because you had been drinking? 0  9. Have you or someone else been injured as a result of your drinking? 0  10. Has a relative or friend or a doctor or  another health worker been concerned about your drinking or suggested you cut down? 0  Alcohol Use Disorder Identification Test Final Score (AUDIT) 11      Patient-reported   A score of 3 or more in women, and 4 or more in men indicates increased risk for alcohol abuse, EXCEPT if all of the points are from question 1   No results found for any visits on 02/14/24.  Assessment & Plan    Routine Health Maintenance and Physical Exam  Immunization History  Administered Date(s) Administered    Astrazeneca Covid-19 Vaccine, Pf, 0.5 Ml Non Us   02/17/2021   Dtap, Unspecified 10/27/1985, 03/04/1986, 05/06/1986, 07/06/1987, 07/01/1990   Hep A / Hep B 12/01/2005   Hep B, Unspecified 08/05/1997, 09/09/1997, 02/10/1998   Janssen (J&J) SARS-COV-2 Vaccination 02/02/2020   Polio, Unspecified 10/27/1985, 03/04/1986, 07/06/1987, 07/01/1990   Td 11/22/2000    Health Maintenance  Topic Date Due   Pneumococcal Vaccine 87-48 Years old (1 of 2 - PCV) Never done   DTaP/Tdap/Td (7 - Tdap) 11/22/2010   COVID-19 Vaccine (2 - Janssen risk series) 03/17/2021   INFLUENZA VACCINE  05/23/2024   Cervical Cancer Screening (HPV/Pap Cotest)  01/02/2028   Hepatitis C Screening  Completed   HIV Screening  Completed   HPV VACCINES  Aged Out   Meningococcal B Vaccine  Aged Out    Problem List Items Addressed This Visit       Musculoskeletal and Integument   Dermatitis   Chronic Recurrent skin rash, exacerbated by travel abroad. Current rash located on arm. Previous treatments include Benadryl, prednisone , cortisone injection, and triamcinolone. Clobetasol  has been used in the past. She reports prednisone  causes irritability. Plan to use clobetasol  twice daily for up to two weeks, stopping earlier if rash clears. - Prescribe clobetasol  twice daily for up to two weeks, discontinue if rash clears earlier      Relevant Medications   clobetasol  (OLUX ) 0.05 % topical foam     Other   Tobacco use disorder   She  smokes 1.5 packs per day. Discussed risks associated with smoking, including increased risk of lung disease and invasive pneumonia. Encouraged reduction in smoking to half a pack per day. Discussed pneumococcal vaccine due to smoking history. - Administer pneumococcal vaccine - Encourage reduction in smoking to half a pack per day      Relevant Orders   CBC   Obesity, morbid, BMI 40.0-49.9 (HCC)   Relevant Orders   Hemoglobin A1c   CBC   CMP14+EGFR   Lipid panel   Annual physical exam - Primary  Annual physical examination conducted. She reports minimal exercise and irregular eating patterns, primarily consuming coffee in the morning, a snack during the day, and dinner. Encouraged a balanced diet and regular exercise, aiming for 150 minutes of activity per week. Labs ordered for diabetes screening (A1c), CBC for anemia, metabolic panel for liver, kidneys, and electrolytes, and cholesterol check. Discussed routine screenings for HIV and hepatitis as part of standard care. - Order A1c, CBC, metabolic panel, and cholesterol check (phlebotomist is not available for the morning so patient will return for labs 02/15/24) - Encourage balanced diet and regular exercise, aiming for 150 minutes per week - patient received tdap today       Other Visit Diagnoses       Screening for deficiency anemia       Relevant Orders   CBC     Screening for lipid disorders       Relevant Orders   Lipid panel     Screening for diabetes mellitus       Relevant Orders   Hemoglobin A1c       Assessment & Plan    Tetanus vaccination Discussed tetanus vaccination, which protects against bacteria in the soil causing lockjaw and meningitis. She agreed to receive the tetanus vaccine. - Administered tetanus vaccine       No follow-ups on file.       Mimi Alt, MD  [facility] [phone] (phone) [phone] (fax)  [facility]

## 2024-02-21 LAB — CMP14+EGFR
ALT: 35 IU/L — ABNORMAL HIGH (ref 0–32)
AST: 24 IU/L (ref 0–40)
Albumin: 4.4 g/dL (ref 3.9–4.9)
Alkaline Phosphatase: 65 IU/L (ref 44–121)
BUN/Creatinine Ratio: 11 (ref 9–23)
BUN: 10 mg/dL (ref 6–20)
Bilirubin Total: 0.3 mg/dL (ref 0.0–1.2)
CO2: 21 mmol/L (ref 20–29)
Calcium: 9.5 mg/dL (ref 8.7–10.2)
Chloride: 102 mmol/L (ref 96–106)
Creatinine, Ser: 0.87 mg/dL (ref 0.57–1.00)
Globulin, Total: 2.7 g/dL (ref 1.5–4.5)
Glucose: 81 mg/dL (ref 70–99)
Potassium: 4.8 mmol/L (ref 3.5–5.2)
Sodium: 141 mmol/L (ref 134–144)
Total Protein: 7.1 g/dL (ref 6.0–8.5)
eGFR: 87 mL/min/{1.73_m2} (ref >59)

## 2024-02-21 LAB — CBC
Hematocrit: 44.2 % (ref 34.0–46.6)
Hemoglobin: 14.7 g/dL (ref 11.1–15.9)
MCH: 31.7 pg (ref 26.6–33.0)
MCHC: 33.3 g/dL (ref 31.5–35.7)
MCV: 95 fL (ref 79–97)
Platelets: 365 10*3/uL (ref 150–450)
RBC: 4.64 x10E6/uL (ref 3.77–5.28)
RDW: 12.9 % (ref 11.7–15.4)
WBC: 11.7 10*3/uL — ABNORMAL HIGH (ref 3.4–10.8)

## 2024-02-21 LAB — LIPID PANEL
Chol/HDL Ratio: 5.2 ratio — ABNORMAL HIGH (ref 0.0–4.4)
Cholesterol, Total: 300 mg/dL — ABNORMAL HIGH (ref 100–199)
HDL: 58 mg/dL (ref >39)
LDL Chol Calc (NIH): 183 mg/dL — ABNORMAL HIGH (ref 0–99)
Triglycerides: 305 mg/dL — ABNORMAL HIGH (ref 0–149)
VLDL Cholesterol Cal: 59 mg/dL — ABNORMAL HIGH (ref 5–40)

## 2024-02-21 LAB — HEMOGLOBIN A1C
Est. average glucose Bld gHb Est-mCnc: 111 mg/dL
Hgb A1c MFr Bld: 5.5 % (ref 4.8–5.6)

## 2024-02-25 ENCOUNTER — Other Ambulatory Visit (HOSPITAL_COMMUNITY): Payer: Self-pay

## 2024-02-26 ENCOUNTER — Encounter: Payer: Self-pay | Admitting: Family Medicine

## 2024-03-03 NOTE — Progress Notes (Unsigned)
 PCP:  Mimi Alt, MD   No chief complaint on file.    HPI:      [name] is a 38 y.o. No obstetric history on file. who LMP was No LMP recorded., presents today for her annual examination.  Her menses are monthly off nexplanon , lasting 7 days, light to mod flow and mild to mod dysmen (flow and dysmen alternate), no BTB. Dysmen improved with NSAIDs prn.   Sex activity: single partner, contraception - Nexplanon  removed 3/23. Partner had vasectomy. No pain/bleeding.   Last Pap: 01/02/23 Results were no abnormalities/neg HPV DNA.  11/22/21 Results were no abnormalities, neg HPV DNA.   11/17/20 Results were LGSIL/neg HPV DNA.  09/30/19 Results were normal, neg HPV DNA.   11/2015 Results were: low-grade squamous intraepithelial neoplasia (LGSIL - encompassing HPV,mild dysplasia,CIN I) /neg HPV DNA.  Neg colpo bx with Dr. Clemetine Cypher 1/18.   Also with LGSIL 2016 and neg colpo bx 2017.  Hx of STDs: HPV on pap; HSV by IgG with more frequent outbreaks with menses now (since off [facility]); takes valtrex  prn sx but not working as well now per pt.   There is no FH of breast cancer. There is no FH of ovarian cancer. The patient does occas self-breast exams.  Tobacco use: smokes 1 ppd, not ready to quit Alcohol use: few days weekly Daily marijuana use.  Exercise: min active  She does get adequate calcium and Vitamin D in her diet.  Patient Active Problem List   Diagnosis Date Noted   Annual physical exam 02/14/2024   Dermatitis 02/14/2024   Obesity, morbid, BMI 40.0-49.9 (HCC) 02/02/2023   Tobacco use disorder 02/02/2023   History of depression 02/02/2023   LGSIL on Pap smear of cervix 11/17/2020    Past Surgical History:  Procedure Laterality Date   COLPOSCOPY  01/04/2006    Family History  Problem Relation Age of Onset   Anemia Mother    Hypertension Father    Ulcerative colitis Sister    Melanoma Sister    Parkinson's disease Maternal Grandmother    Prostate  cancer Maternal Grandfather     Social History   Socioeconomic History   Marital status: Single    Spouse name: Not on file   Number of children: Not on file   Years of education: Not on file   Highest education level: Some college, no degree  Occupational History   Not on file  Tobacco Use   Smoking status: Every Day    Current packs/day: 1.00    Types: Cigarettes   Smokeless tobacco: Never  Vaping Use   Vaping status: Never Used  Substance and Sexual Activity   Alcohol use: Yes    Alcohol/week: 0.0 standard drinks of alcohol   Drug use: Never   Sexual activity: Yes    Birth control/protection: Surgical    Comment: Vasectomy  Other Topics Concern   Not on file  Social History Narrative   Not on file   Social Drivers of Health   Financial Resource Strain: Low Risk  (02/13/2024)   Overall Financial Resource Strain (CARDIA)    Difficulty of Paying Living Expenses: Not very hard  Food Insecurity: No Food Insecurity (02/13/2024)   Hunger Vital Sign    Worried About Running Out of Food in the Last Year: Never true    Ran Out of Food in the Last Year: Never true  Transportation Needs: No Transportation Needs (02/13/2024)   PRAPARE - Transportation    Lack  of Transportation (Medical): No    Lack of Transportation (Non-Medical): No  Physical Activity: Insufficiently Active (02/13/2024)   Exercise Vital Sign    Days of Exercise per Week: 1 day    Minutes of Exercise per Session: 20 min  Stress: No Stress Concern Present (02/13/2024)   [employer] of Occupational Health - Occupational Stress Questionnaire    Feeling of Stress : Only a little  Social Connections: Moderately Isolated (02/13/2024)   Social Connection and Isolation Panel [NHANES]    Frequency of Communication with Friends and Family: Twice a week    Frequency of Social Gatherings with Friends and Family: Once a week    Attends Religious Services: Never    Database administrator or Organizations: No     Attends Engineer, structural: Not on file    Marital Status: Married  Catering manager Violence: Not At Risk (02/14/2024)   Humiliation, Afraid, Rape, and Kick questionnaire    Fear of Current or Ex-Partner: No    Emotionally Abused: No    Physically Abused: No    Sexually Abused: No     Current Outpatient Medications:    CALCIUM PO, Take by mouth., Disp: , Rfl:    clobetasol  (TEMOVATE ) 0.05 % GEL, Apply 1 Application topically 2 (two) times daily., Disp: 30 g, Rfl: 1   Ferrous Sulfate (IRON PO), Take by mouth., Disp: , Rfl:    valACYclovir  (VALTREX ) 1000 MG tablet, Take 1 tablet (1,000 mg total) by mouth daily., Disp: 90 tablet, Rfl: 1   VITAMIN D PO, Take by mouth., Disp: , Rfl:    ROS:  Review of Systems  Constitutional:  Negative for fatigue, fever and unexpected weight change.  Respiratory:  Negative for cough, shortness of breath and wheezing.   Cardiovascular:  Negative for chest pain, palpitations and leg swelling.  Gastrointestinal:  Negative for blood in stool, constipation, diarrhea, nausea and vomiting.  Endocrine: Negative for cold intolerance, heat intolerance and polyuria.  Genitourinary:  Negative for dyspareunia, dysuria, flank pain, frequency, genital sores, hematuria, menstrual problem, pelvic pain, urgency, vaginal bleeding, vaginal discharge and vaginal pain.  Musculoskeletal:  Negative for back pain, joint swelling and myalgias.  Skin:  Negative for rash.  Neurological:  Negative for dizziness, syncope, light-headedness, numbness and headaches.  Hematological:  Negative for adenopathy.  Psychiatric/Behavioral:  Negative for agitation, confusion, dysphoric mood, sleep disturbance and suicidal ideas. The patient is not nervous/anxious.   BREAST: No symptoms   Objective: There were no vitals taken for this visit.   Physical Exam Constitutional:      Appearance: She is well-developed.  Genitourinary:     Vulva normal.     Right Labia: No rash,  tenderness or lesions.    Left Labia: No tenderness, lesions or rash.    No vaginal discharge, erythema or tenderness.      Right Adnexa: not tender and no mass present.    Left Adnexa: not tender and no mass present.    No cervical friability or polyp.     Uterus is not enlarged or tender.  Breasts:    Right: No mass, nipple discharge, skin change or tenderness.     Left: No mass, nipple discharge, skin change or tenderness.  Neck:     Thyroid: No thyromegaly.  Cardiovascular:     Rate and Rhythm: Normal rate and regular rhythm.     Heart sounds: Normal heart sounds. No murmur heard. Pulmonary:     Effort: Pulmonary effort is normal.  Breath sounds: Normal breath sounds.  Abdominal:     Palpations: Abdomen is soft.     Tenderness: There is no abdominal tenderness. There is no guarding or rebound.  Musculoskeletal:        General: Normal range of motion.     Cervical back: Normal range of motion.  Lymphadenopathy:     Cervical: No cervical adenopathy.  Neurological:     General: No focal deficit present.     Mental Status: She is alert and oriented to person, place, and time.     Cranial Nerves: No cranial nerve deficit.  Skin:    General: Skin is warm and dry.  Psychiatric:        Mood and Affect: Mood normal.        Behavior: Behavior normal.        Thought Content: Thought content normal.        Judgment: Judgment normal.  Vitals reviewed.    Assessment/Plan: Encounter for annual routine gynecological examination  Cervical cancer screening - Plan: Cytology - PAP  Screening for HPV (human papillomavirus) - Plan: Cytology - PAP  LGSIL on Pap smear of cervix - Plan: Cytology - PAP; repeat today, will f/u if abn.   Herpes simplex vulvovaginitis - Plan: valACYclovir  (VALTREX ) 500 MG tablet; sx increased now, particularly with menses. Change to daily valtrex , Rx eRxd. F/u prn.   No orders of the defined types were placed in this encounter.    GYN counsel  adequate intake of calcium and vitamin D, diet and exercise; tobacco and marijuana cessation     F/U  No follow-ups on file.  Ronne Savoia B. Jamichael Knotts, PA-C 03/03/2024 5:10 PM

## 2024-03-04 ENCOUNTER — Other Ambulatory Visit (HOSPITAL_COMMUNITY)
Admission: RE | Admit: 2024-03-04 | Discharge: 2024-03-04 | Disposition: A | Discharge status: Home / Self Care | Source: Ambulatory Visit | Attending: Obstetrics and Gynecology | Admitting: Obstetrics and Gynecology

## 2024-03-04 ENCOUNTER — Encounter: Payer: Self-pay | Admitting: Obstetrics and Gynecology

## 2024-03-04 ENCOUNTER — Ambulatory Visit (INDEPENDENT_AMBULATORY_CARE_PROVIDER_SITE_OTHER): Admitting: Obstetrics and Gynecology

## 2024-03-04 VITALS — BP 122/75 | HR 80 | Ht 66.0 in | Wt 251.0 lb

## 2024-03-04 DIAGNOSIS — Z124 Encounter for screening for malignant neoplasm of cervix: Secondary | ICD-10-CM

## 2024-03-04 DIAGNOSIS — G4482 Headache associated with sexual activity: Secondary | ICD-10-CM

## 2024-03-04 DIAGNOSIS — Z1151 Encounter for screening for human papillomavirus (HPV): Secondary | ICD-10-CM

## 2024-03-04 DIAGNOSIS — N921 Excessive and frequent menstruation with irregular cycle: Secondary | ICD-10-CM

## 2024-03-04 DIAGNOSIS — Z01419 Encounter for gynecological examination (general) (routine) without abnormal findings: Secondary | ICD-10-CM | POA: Diagnosis not present

## 2024-03-04 DIAGNOSIS — A6004 Herpesviral vulvovaginitis: Secondary | ICD-10-CM

## 2024-03-04 DIAGNOSIS — R87612 Low grade squamous intraepithelial lesion on cytologic smear of cervix (LGSIL): Secondary | ICD-10-CM | POA: Diagnosis present

## 2024-03-04 MED ORDER — VALACYCLOVIR HCL 500 MG PO TABS: 500.0000 mg | ORAL_TABLET | Freq: Two times a day (BID) | ORAL | 1 refills | Status: AC

## 2024-03-04 NOTE — Patient Instructions (Signed)
 I value your feedback and you entrusting [place] with your care. If you get a [place] patient survey, I would appreciate you taking the time to let us know about your experience today. Thank you! ? ? ?

## 2024-03-07 LAB — CYTOLOGY - PAP
Comment: NEGATIVE
Diagnosis: UNDETERMINED — AB
High risk HPV: NEGATIVE

## 2024-03-10 ENCOUNTER — Ambulatory Visit: Payer: Self-pay | Admitting: Obstetrics and Gynecology

## 2024-04-01 ENCOUNTER — Encounter: Payer: Self-pay | Admitting: Obstetrics and Gynecology

## 2024-06-04 ENCOUNTER — Ambulatory Visit: Admitting: Family Medicine

## 2024-06-04 ENCOUNTER — Encounter: Payer: Self-pay | Admitting: Family Medicine

## 2024-06-04 VITALS — BP 119/71 | HR 80 | Resp 16 | Ht 66.0 in | Wt 250.0 lb

## 2024-06-04 DIAGNOSIS — F4323 Adjustment disorder with mixed anxiety and depressed mood: Secondary | ICD-10-CM | POA: Insufficient documentation

## 2024-06-04 DIAGNOSIS — F419 Anxiety disorder, unspecified: Secondary | ICD-10-CM | POA: Diagnosis not present

## 2024-06-04 DIAGNOSIS — F430 Acute stress reaction: Secondary | ICD-10-CM | POA: Insufficient documentation

## 2024-06-04 MED ORDER — CITALOPRAM HYDROBROMIDE 10 MG PO TABS: ORAL_TABLET | ORAL | 0 refills | Status: DC

## 2024-06-04 MED ORDER — BUSPIRONE HCL 5 MG PO TABS: 5.0000 mg | ORAL_TABLET | Freq: Two times a day (BID) | ORAL | 2 refills | Status: DC

## 2024-06-04 NOTE — Progress Notes (Signed)
 Established patient visit   Patient: Monique Mcmahon   DOB: 1986/10/14   38 y.o. Female  MRN: [id number] Visit Date: 06/04/2024  Today's healthcare provider: Rockie Agent, MD   Chief Complaint  Patient presents with   Anxiety    F/u on Anxiety No other concerns   Subjective     HPI     Anxiety    Additional comments: F/u on Anxiety No other concerns      Last edited by Marylen Odella CROME, CMA on 06/04/2024  9:39 AM.       Discussed the use of AI scribe software for clinical note transcription with the patient, who gave verbal consent to proceed.  History of Present Illness Monique Mcmahon is a 38 year old female who presents with anxiety.  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 Her initial therapy session with a therapist specializing in work-life transitions was postponed to next week.  She experiences symptoms of irritability, mind racing, heart racing, and difficulty sleeping. Panic attacks have made it challenging for her to perform routine activities such as grocery shopping. Her Generalized Anxiety Disorder (GAD-7) score has increased from 9 to 17 over the past four months.  She has a history of using a low dosage of Xanax approximately twenty years ago during college but is not currently on any anxiety medications. She is currently taking Zyrtec as needed and Valtrex . She has reduced alcohol consumption to weekends only.  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 She has been on her parents' insurance and the [employer], indicating a stable insurance history. She is actively seeking therapy to manage her anxiety and is motivated to address her mental health  challenges.     Past Medical History:  Diagnosis Date   Generalized anxiety disorder    Herpes genitalis    HSV 2 pos by IgG   Major depression    Pap smear abnormality of cervix with LGSIL 2017   Pap smear abnormality of vagina with ASC-US  2016    Medications: Outpatient Medications Prior to Visit  Medication Sig   cetirizine (ZYRTEC ALLERGY) 10 MG tablet 1 tablet Orally Once a day; Duration: 30 day(s)   CALCIUM PO Take by mouth.   clobetasol  (TEMOVATE ) 0.05 % GEL Apply 1 Application topically 2 (two) times daily.   Ferrous Sulfate (IRON PO) Take by mouth.   valACYclovir  (VALTREX ) 500 MG tablet Take 1 tablet (500 mg total) by mouth 2 (two) times daily. For 3 days prn sx   VITAMIN D PO Take by mouth.   No facility-administered medications prior to visit.    Review of Systems  Last metabolic panel Lab Results  Component Value Date   GLUCOSE 81 02/20/2024   NA 141 02/20/2024   K 4.8 02/20/2024   CL 102 02/20/2024   CO2 21 02/20/2024   BUN 10 02/20/2024   CREATININE 0.87 02/20/2024   EGFR 87 02/20/2024   CALCIUM 9.5 02/20/2024   PROT 7.1 02/20/2024   ALBUMIN 4.4 02/20/2024   LABGLOB 2.7 02/20/2024   AGRATIO 1.7 02/02/2023   BILITOT 0.3 02/20/2024   ALKPHOS 65 02/20/2024   AST 24 02/20/2024   ALT 35 (H) 02/20/2024  Last lipids Lab Results  Component Value Date   CHOL 300 (H) 02/20/2024   HDL 58 02/20/2024   LDLCALC 183 (H) 02/20/2024   TRIG 305 (H) 02/20/2024   CHOLHDL 5.2 (H) 02/20/2024   The ASCVD Risk score (Arnett DK, et al., 2019) failed to calculate for the following reasons:   The 2019 ASCVD risk score is only valid for ages 86 to 68  Last hemoglobin A1c Lab Results  Component Value Date   HGBA1C 5.5 02/20/2024   Last thyroid functions Lab Results  Component Value Date   TSH 2.140 02/02/2023   Last vitamin D No results found for: 25OHVITD2, 25OHVITD3, VD25OH Last vitamin B12 and Folate No results found for: VITAMINB12,  FOLATE     06/04/2024    9:40 AM 02/14/2024    8:13 AM  GAD 7 : Generalized Anxiety Score  Nervous, Anxious, on Edge 3 2  Control/stop worrying 3 0  Worry too much - different things 3 0  Trouble relaxing 3 3  Restless 1 2  Easily annoyed or irritable 3 2  Afraid - awful might happen 1 0  Total GAD 7 Score 17 9  Anxiety Difficulty Extremely difficult Somewhat difficult          Objective    BP 119/71 (BP Location: Right Arm, Patient Position: Sitting, Cuff Size: Normal)   Pulse 80   Resp 16   Ht 5' 6 (1.676 m)   Wt 250 lb (113.4 kg)   SpO2 100%   BMI 40.35 kg/m  BP Readings from Last 3 Encounters:  06/04/24 119/71  03/04/24 122/75  02/14/24 129/89   Wt Readings from Last 3 Encounters:  06/04/24 250 lb (113.4 kg)  03/04/24 251 lb (113.9 kg)  02/14/24 247 lb (112 kg)        Physical Exam Constitutional:      General: She is not in acute distress.    Appearance: Normal appearance. She is normal weight. She is not ill-appearing, toxic-appearing or diaphoretic.     Comments: Well groomed, calmly sitting   Neurological:     Mental Status: She is alert.  Psychiatric:        Attention and Perception: Attention and perception normal. She is attentive. She does not perceive auditory or visual hallucinations.        Mood and Affect: Mood and affect normal.        Speech: Speech normal.        Behavior: Behavior normal. Behavior is cooperative.        Thought Content: Thought content normal. Thought content is not paranoid or delusional. Thought content does not include homicidal or suicidal ideation. Thought content does not include homicidal or suicidal plan.        Judgment: Judgment normal.       No results found for any visits on 06/04/24.  Assessment & Plan     Problem List Items Addressed This Visit       Other   Acute stress reaction - Primary   Relevant Medications   citalopram  (CELEXA ) 10 MG tablet   busPIRone  (BUSPAR ) 5 MG tablet   Other  Visit Diagnoses       Anxiety       Relevant Medications   citalopram  (CELEXA ) 10 MG tablet   busPIRone  (BUSPAR ) 5 MG tablet        Assessment & Plan Generalized anxiety disorder Generalized anxiety disorder with increased severity, indicated by a GAD-7 score of 17, up from 9  four months ago. Symptoms include anxiety, irritability, panic attacks, and difficulty sleeping, exacerbated by a major work-life change. No current suicidal ideation or depression. Acute stress due to family business responsibilities and future plans to sell the business. Previous use of anxiolytics approximately 20 years ago. Therapy is considered the first-line treatment, with medications as adjunctive support. - Prescribe buspirone  5 mg twice daily as needed for acute anxiety. - Prescribe citalopram  10 mg once daily, with the option to increase to 20 mg after one week. - Advise against using buspirone  on days when consuming alcohol until effects are known. - Encourage continuation of therapy sessions, with the next session scheduled for next week. - Schedule a follow-up appointment in four weeks, with the option for an in-person or virtual visit. - Instruct to contact the office if experiencing adverse effects from medication or any suicidal thoughts.     Return in about 1 month (around 07/05/2024) for Anxiety.         Rockie Agent, MD  [facility] [phone] (phone) [phone] (fax)  [facility]

## 2024-06-10 ENCOUNTER — Encounter: Payer: Self-pay | Admitting: Dermatology

## 2024-06-10 ENCOUNTER — Ambulatory Visit: Payer: 59 | Admitting: Dermatology

## 2024-06-10 DIAGNOSIS — W908XXA Exposure to other nonionizing radiation, initial encounter: Secondary | ICD-10-CM

## 2024-06-10 DIAGNOSIS — L821 Other seborrheic keratosis: Secondary | ICD-10-CM

## 2024-06-10 DIAGNOSIS — D1801 Hemangioma of skin and subcutaneous tissue: Secondary | ICD-10-CM

## 2024-06-10 DIAGNOSIS — L814 Other melanin hyperpigmentation: Secondary | ICD-10-CM

## 2024-06-10 DIAGNOSIS — Z1283 Encounter for screening for malignant neoplasm of skin: Secondary | ICD-10-CM

## 2024-06-10 DIAGNOSIS — L578 Other skin changes due to chronic exposure to nonionizing radiation: Secondary | ICD-10-CM

## 2024-06-10 DIAGNOSIS — D229 Melanocytic nevi, unspecified: Secondary | ICD-10-CM

## 2024-06-10 NOTE — Patient Instructions (Addendum)
 Melanoma ABCDEs  Melanoma is the most dangerous type of skin cancer, and is the leading cause of death from skin disease.  You are more likely to develop melanoma if you: Have light-colored skin, light-colored eyes, or red or blond hair Spend a lot of time in the sun Tan regularly, either outdoors or in a tanning bed Have had blistering sunburns, especially during childhood Have a close family member who has had a melanoma Have atypical moles or large birthmarks  Early detection of melanoma is key since treatment is typically straightforward and cure rates are extremely high if we catch it early.   The first sign of melanoma is often a change in a mole or a new dark spot.  The ABCDE system is a way of remembering the signs of melanoma.  A for asymmetry:  The two halves do not match. B for border:  The edges of the growth are irregular. C for color:  A mixture of colors are present instead of an even brown color. D for diameter:  Melanomas are usually (but not always) greater than 6mm - the size of a pencil eraser. E for evolution:  The spot keeps changing in size, shape, and color.  Please check your skin once per month between visits. You can use a small mirror in front and a large mirror behind you to keep an eye on the back side or your body.   If you see any new or changing lesions before your next follow-up, please call to schedule a visit.  Please continue daily skin protection including broad spectrum sunscreen SPF 30+ to sun-exposed areas, reapplying every 2 hours as needed when you're outdoors.    Recommend daily broad spectrum sunscreen SPF 30+ to sun-exposed areas, reapply every 2 hours as needed. Call for new or changing lesions.  Staying in the shade or wearing long sleeves, sun glasses (UVA+UVB protection) and wide brim hats (4-inch brim around the entire circumference of the hat) are also recommended for sun protection.    Due to recent changes in healthcare laws, you may  see results of your pathology and/or laboratory studies on MyChart before the doctors have had a chance to review them. We understand that in some cases there may be results that are confusing or concerning to you. Please understand that not all results are received at the same time and often the doctors may need to interpret multiple results in order to provide you with the best plan of care or course of treatment. Therefore, we ask that you please give us  2 business days to thoroughly review all your results before contacting the office for clarification. Should we see a critical lab result, you will be contacted sooner.   If You Need Anything After Your Visit  If you have any questions or concerns for your doctor, please call our main line at [phone] and press option 4 to reach your doctor's medical assistant. If no one answers, please leave a voicemail as directed and we will return your call as soon as possible. Messages left after 4 pm will be answered the following business day.   You may also send us  a message via MyChart. We typically respond to MyChart messages within 1-2 business days.  For prescription refills, please ask your pharmacy to contact our office. Our fax number is [phone].  If you have an urgent issue when the clinic is closed that cannot wait until the next business day, you can page your doctor at the  number below.    Please note that while we do our best to be available for urgent issues outside of office hours, we are not available 24/7.   If you have an urgent issue and are unable to reach us , you may choose to seek medical care at your doctor's office, retail clinic, urgent care center, or emergency room.  If you have a medical emergency, please immediately call 911 or go to the emergency department.  Pager Numbers  - Dr. Hester: [phone]  - Dr. Jackquline: [phone]  - Dr. Claudene: [phone]   In the event of inclement weather, please call  our main line at [phone] for an update on the status of any delays or closures.  Dermatology Medication Tips: Please keep the boxes that topical medications come in in order to help keep track of the instructions about where and how to use these. Pharmacies typically print the medication instructions only on the boxes and not directly on the medication tubes.   If your medication is too expensive, please contact our office at [phone] option 4 or send us  a message through MyChart.   We are unable to tell what your co-pay for medications will be in advance as this is different depending on your insurance coverage. However, we may be able to find a substitute medication at lower cost or fill out paperwork to get insurance to cover a needed medication.   If a prior authorization is required to get your medication covered by your insurance company, please allow us  1-2 business days to complete this process.  Drug prices often vary depending on where the prescription is filled and some pharmacies may offer cheaper prices.  The website www.goodrx.com contains coupons for medications through different pharmacies. The prices here do not account for what the cost may be with help from insurance (it may be cheaper with your insurance), but the website can give you the price if you did not use any insurance.  - You can print the associated coupon and take it with your prescription to the pharmacy.  - You may also stop by our office during regular business hours and pick up a GoodRx coupon card.  - If you need your prescription sent electronically to a different pharmacy, notify our office through [facility] or by phone at [phone] option 4.     Si Usted Necesita Algo Despus de Su Visita  Tambin puede enviarnos un mensaje a travs de Clinical cytogeneticist. Por lo general respondemos a los mensajes de MyChart en el transcurso de 1 a 2 das hbiles.  Para renovar recetas, por favor pida a su  farmacia que se ponga en contacto con nuestra oficina. [place] de fax es [place] [phone].  Si tiene un asunto urgente cuando la clnica est cerrada y que no puede esperar hasta el siguiente da hbil, puede llamar/localizar a su doctor(a) al nmero que aparece a continuacin.   Por favor, tenga en cuenta que aunque hacemos todo lo posible para estar disponibles para asuntos urgentes fuera del horario de [place], no estamos disponibles las 24 horas del da, los 7 [address]  [address] de la [place].   Si tiene un problema urgente y no puede comunicarse con nosotros, puede optar por buscar atencin mdica  en el consultorio de su doctor(a), en una clnica privada, en un centro de atencin urgente o en una sala de emergencias.  Si tiene Engineer, drilling, por favor llame inmediatamente al 911 o vaya a la sala de emergencias.  Nmeros  de bper  - Dr. Hester: [phone]  - Dra. Jackquline: [phone]  - Dr. Claudene: [phone]   En caso de inclemencias del tiempo, por favor llame a landry capes principal al [phone] para una actualizacin sobre el [place] de cualquier retraso o cierre.  Consejos para la medicacin en dermatologa: Por favor, guarde las cajas en las que vienen los medicamentos de uso tpico para ayudarle a seguir las instrucciones sobre dnde y cmo usarlos. Las farmacias generalmente imprimen las instrucciones del medicamento slo en las cajas y no directamente en los tubos del [place].   Si su medicamento es muy caro, por favor, pngase en contacto con landry rieger llamando al [phone] y presione la opcin 4 o envenos un mensaje a travs de Clinical cytogeneticist.   No podemos decirle cul ser su copago por los medicamentos por adelantado ya que esto es diferente dependiendo de la cobertura de su seguro. Sin embargo, es posible que podamos encontrar un medicamento sustituto a Audiological scientist un formulario para que el seguro cubra el medicamento que se considera necesario.    Si se requiere una autorizacin previa para que su compaa de seguros malta su medicamento, por favor permtanos de 1 a 2 das hbiles para completar este proceso.  Los precios de los medicamentos varan con frecuencia dependiendo del Environmental consultant de dnde se surte la receta y alguna farmacias pueden ofrecer precios ms baratos.  El sitio web www.goodrx.com tiene cupones para medicamentos de Health and safety inspector. Los precios aqu no tienen en cuenta lo que podra costar con la ayuda del seguro (puede ser ms barato con su seguro), pero el sitio web puede darle el precio si no utiliz Tourist information centre manager.  - Puede imprimir el cupn correspondiente y llevarlo con su receta a la farmacia.  - Tambin puede pasar por nuestra oficina durante el horario de atencin regular y Education officer, museum una tarjeta de cupones de GoodRx.  - Si necesita que su receta se enve electrnicamente a una farmacia diferente, informe a nuestra oficina a travs de MyChart de [place] o por telfono llamando al [phone] y presione la opcin 4.

## 2024-06-10 NOTE — Progress Notes (Signed)
   Follow-Up Visit   Subjective  Monique Mcmahon is a 38 y.o. female who presents for the following: Skin Cancer Screening and Full Body Skin Exam. No personal hx of skin cancer;  Sister Dx with MIS at age 47. Mother has had BCC. Patient reports no spots of concern at this time.   The patient presents for Total-Body Skin Exam (TBSE) for skin cancer screening and mole check. The patient has spots, moles and lesions to be evaluated, some may be new or changing and the patient may have concern these could be cancer.    The following portions of the chart were reviewed this encounter and updated as appropriate: medications, allergies, medical history  Review of Systems:  No other skin or systemic complaints except as noted in HPI or Assessment and Plan.  Objective  Well appearing patient in no apparent distress; mood and affect are within normal limits.  A full examination was performed including scalp, head, eyes, ears, nose, lips, neck, chest, axillae, abdomen, back, buttocks, bilateral upper extremities, bilateral lower extremities, hands, feet, fingers, toes, fingernails, and toenails. All findings within normal limits unless otherwise noted below.   Relevant physical exam findings are noted in the Assessment and Plan.  Exam of nails limited by presence of nail polish.    Assessment & Plan   FAMILY HISTORY OF SKIN CANCER What type(s): MM, BCC Who affected: Sister, Mother   SKIN CANCER SCREENING PERFORMED TODAY.  ACTINIC DAMAGE - Chronic condition, secondary to cumulative UV/sun exposure - diffuse scaly erythematous macules with underlying dyspigmentation - Recommend daily broad spectrum sunscreen SPF 30+ to sun-exposed areas, reapply every 2 hours as needed.  - Staying in the shade or wearing long sleeves, sun glasses (UVA+UVB protection) and wide brim hats (4-inch brim around the entire circumference of the hat) are also recommended for sun protection.  - Call for new or  changing lesions.  LENTIGINES, SEBORRHEIC KERATOSES, HEMANGIOMAS - Benign normal skin lesions - Benign-appearing - Call for any changes  MELANOCYTIC NEVI - Tan-brown and/or pink-flesh-colored symmetric macules and papules - Benign appearing on exam today - Observation - Call clinic for new or changing moles - Recommend daily use of broad spectrum spf 30+ sunscreen to sun-exposed areas.    MULTIPLE BENIGN NEVI   LENTIGINES   SEBORRHEIC KERATOSES   ACTINIC ELASTOSIS   CHERRY ANGIOMA   Return in about 1 year (around 06/10/2025) for TBSE.  I, Emerick Ege, CMA am acting as scribe for Boneta Sharps, MD.   Documentation: I have reviewed the above documentation for accuracy and completeness, and I agree with the above.  Boneta Sharps, MD

## 2024-06-27 ENCOUNTER — Ambulatory Visit: Admitting: Family Medicine

## 2024-07-08 ENCOUNTER — Encounter: Payer: Self-pay | Admitting: Family Medicine

## 2024-07-08 ENCOUNTER — Ambulatory Visit: Admitting: Family Medicine

## 2024-07-08 VITALS — BP 127/83 | HR 71 | Temp 98.8°F | Ht 66.0 in | Wt 248.3 lb

## 2024-07-08 DIAGNOSIS — R4584 Anhedonia: Secondary | ICD-10-CM | POA: Diagnosis not present

## 2024-07-08 DIAGNOSIS — F4323 Adjustment disorder with mixed anxiety and depressed mood: Secondary | ICD-10-CM | POA: Diagnosis not present

## 2024-07-08 DIAGNOSIS — S01331A Puncture wound without foreign body of right ear, initial encounter: Secondary | ICD-10-CM

## 2024-07-08 DIAGNOSIS — F4322 Adjustment disorder with anxiety: Secondary | ICD-10-CM | POA: Diagnosis not present

## 2024-07-08 DIAGNOSIS — F17200 Nicotine dependence, unspecified, uncomplicated: Secondary | ICD-10-CM

## 2024-07-08 DIAGNOSIS — S01339A Puncture wound without foreign body of unspecified ear, initial encounter: Secondary | ICD-10-CM

## 2024-07-08 MED ORDER — CITALOPRAM HYDROBROMIDE 20 MG PO TABS: 20.0000 mg | ORAL_TABLET | Freq: Every day | ORAL | 2 refills | Status: AC

## 2024-07-08 MED ORDER — BUPROPION HCL ER (XL) 150 MG PO TB24: 150.0000 mg | ORAL_TABLET | Freq: Every day | ORAL | 2 refills | Status: AC

## 2024-07-08 NOTE — Assessment & Plan Note (Signed)
 Chronic  Generalized anxiety is improving with current treatment. Anxiety score has decreased from 17 to 4. Celexa  was increased to 20 mg daily, which she is tolerating well. She has not needed to use Buspirone , indicating better control of anxiety symptoms. - Continue Celexa  20 mg daily - Continue Buspirone  5 mg twice daily as needed for anxiety

## 2024-07-08 NOTE — Patient Instructions (Signed)
 To keep you healthy, please keep in mind the following health maintenance items that you are due for:   There are no preventive care reminders to display for this patient.   Best Wishes,   Dr. Lang

## 2024-07-08 NOTE — Assessment & Plan Note (Addendum)
  Chronic  Depressive symptoms are present, with a PHQ-9 score of 10. She reports lack of energy and motivation, which may be more noticeable now that anxiety is better controlled. Wellbutrin  is considered to address these symptoms, with a plan to start at 150 mg once daily. Discussed potential for Wellbutrin  to exacerbate anxiety and advised to report any increase in anxiety symptoms. - Start Wellbutrin  150 mg once daily - Monitor for increased anxiety symptoms and report if they occur

## 2024-07-08 NOTE — Assessment & Plan Note (Signed)
 Nicotine dependence, cigarettes Chronic Continues to smoke approximately 1.5 packs per day. Wellbutrin  may aid in smoking cessation as a secondary benefit. - wellbutrin  150mg  QD - Encourage smoking cessation - Monitor for any reduction in smoking with Wellbutrin  use

## 2024-07-08 NOTE — Progress Notes (Signed)
 Established patient visit   Patient: Monique Mcmahon   DOB: 1986-01-25   38 y.o. Female  MRN: [id number] Visit Date: 07/08/2024  Today's healthcare provider: Rockie Agent, MD   Chief Complaint  Patient presents with   Medical Management of Chronic Issues    Patient is present for one month follow up of anxiety. Patient has been taking citalopram  daily 20 mg, states it is helping. Has not had to take the buspirone  yet. No side effects    skin concern    Patient got her right ear pierced in June, it has a small bump that is tender to the touch near the piercing site. No redness or oozing pus    Subjective     HPI     Medical Management of Chronic Issues    Additional comments: Patient is present for one month follow up of anxiety. Patient has been taking citalopram  daily 20 mg, states it is helping. Has not had to take the buspirone  yet. No side effects         skin concern    Additional comments: Patient got her right ear pierced in June, it has a small bump that is tender to the touch near the piercing site. No redness or oozing pus       Last edited by Cherry Chiquita HERO, CMA on 07/08/2024 12:59 PM.       Discussed the use of AI scribe software for clinical note transcription with the patient, who gave verbal consent to proceed.  History of Present Illness Monique Mcmahon is a 38 year old female with chronic anxiety who presents for follow-up.  She has experienced significant improvement in her anxiety symptoms, with a 70% reduction since her last visit. She has been taking Celexa , initially at 10 mg, and increased to 20 mg daily after one week. She has not started taking buspirone  5 mg twice daily as needed, as she did not feel the need for it. Her anxiety score has decreased from 17 to 4.  She reports a lack of energy and motivation, noting that activities she used to enjoy are no longer appealing. Her PHQ-9 score is 10. She does not feel down, just  lacking energy. Sleep has been an issue but is improving. She experiences mild headaches, which she attributes to medication adjustments.  She mentions a nodule behind her right ear, which developed after an ear piercing three months ago. The nodule is tender but not red or draining. She changed the earring a week ago and noticed the bump yesterday. She describes it as having a small spot that looks like it might drain, but there is no current drainage or redness.  She continues to smoke a pack and a half per day, unchanged from previous discussions. She has not attempted smoking cessation recently.     Past Medical History:  Diagnosis Date   Generalized anxiety disorder    Herpes genitalis    HSV 2 pos by IgG   Major depression    Pap smear abnormality of cervix with LGSIL 2017   Pap smear abnormality of vagina with ASC-US  2016    Medications: Outpatient Medications Prior to Visit  Medication Sig   busPIRone  (BUSPAR ) 5 MG tablet Take 1 tablet (5 mg total) by mouth 2 (two) times daily.   CALCIUM PO Take by mouth.   clobetasol  (TEMOVATE ) 0.05 % GEL Apply 1 Application topically 2 (two) times daily.   Ferrous Sulfate (IRON PO) Take by mouth.  valACYclovir  (VALTREX ) 500 MG tablet Take 1 tablet (500 mg total) by mouth 2 (two) times daily. For 3 days prn sx   VITAMIN D PO Take by mouth.   [DISCONTINUED] citalopram  (CELEXA ) 10 MG tablet Take 1 tablet (10 mg total) by mouth daily for 7 days, THEN 2 tablets (20 mg total) daily for 21 days.   No facility-administered medications prior to visit.      07/08/2024   12:57 PM 06/04/2024    9:40 AM 02/14/2024    8:13 AM  GAD 7 : Generalized Anxiety Score  Nervous, Anxious, on Edge 1 3 2   Control/stop worrying 0 3 0  Worry too much - different things 1 3 0  Trouble relaxing 0 3 3  Restless 0 1 2  Easily annoyed or irritable 2 3 2   Afraid - awful might happen 0 1 0  Total GAD 7 Score 4 17 9   Anxiety Difficulty Somewhat difficult Extremely  difficult Somewhat difficult    Flowsheet Row Office Visit from 07/08/2024 in [facility]  PHQ-9 Total Score 10     Review of Systems  Last metabolic panel Lab Results  Component Value Date   GLUCOSE 81 02/20/2024   NA 141 02/20/2024   K 4.8 02/20/2024   CL 102 02/20/2024   CO2 21 02/20/2024   BUN 10 02/20/2024   CREATININE 0.87 02/20/2024   EGFR 87 02/20/2024   CALCIUM 9.5 02/20/2024   PROT 7.1 02/20/2024   ALBUMIN 4.4 02/20/2024   LABGLOB 2.7 02/20/2024   AGRATIO 1.7 02/02/2023   BILITOT 0.3 02/20/2024   ALKPHOS 65 02/20/2024   AST 24 02/20/2024   ALT 35 (H) 02/20/2024   Last thyroid functions Lab Results  Component Value Date   TSH 2.140 02/02/2023        Objective    BP 127/83 (BP Location: Right Arm, Patient Position: Sitting, Cuff Size: Normal)   Pulse 71   Temp 98.8 F (37.1 C) (Oral)   Ht 5' 6 (1.676 m)   Wt 248 lb 4.8 oz (112.6 kg)   LMP 06/20/2024 (Approximate)   SpO2 99%   BMI 40.08 kg/m   BP Readings from Last 3 Encounters:  07/08/24 127/83  06/04/24 119/71  03/04/24 122/75   Wt Readings from Last 3 Encounters:  07/08/24 248 lb 4.8 oz (112.6 kg)  06/04/24 250 lb (113.4 kg)  03/04/24 251 lb (113.9 kg)        Physical Exam Psychiatric:        Attention and Perception: Attention normal.        Mood and Affect: Mood and affect normal. Mood is not anxious. Affect is not tearful.        Behavior: Behavior normal. Behavior is cooperative.       No results found for any visits on 07/08/24.  Assessment & Plan     Problem List Items Addressed This Visit     Adjustment disorder with mixed anxiety and depressed mood - Primary   Chronic  Generalized anxiety is improving with current treatment. Anxiety score has decreased from 17 to 4. Celexa  was increased to 20 mg daily, which she is tolerating well. She has not needed to use Buspirone , indicating better control of anxiety symptoms. - Continue Celexa  20 mg  daily - Continue Buspirone  5 mg twice daily as needed for anxiety      Relevant Medications   citalopram  (CELEXA ) 20 MG tablet   Anhedonia    Chronic  Depressive  symptoms are present, with a PHQ-9 score of 10. She reports lack of energy and motivation, which may be more noticeable now that anxiety is better controlled. Wellbutrin  is considered to address these symptoms, with a plan to start at 150 mg once daily. Discussed potential for Wellbutrin  to exacerbate anxiety and advised to report any increase in anxiety symptoms. - Start Wellbutrin  150 mg once daily - Monitor for increased anxiety symptoms and report if they occur      Relevant Medications   buPROPion  (WELLBUTRIN  XL) 150 MG 24 hr tablet   Tobacco use disorder   Nicotine dependence, cigarettes Chronic Continues to smoke approximately 1.5 packs per day. Wellbutrin  may aid in smoking cessation as a secondary benefit. - wellbutrin  150mg  QD - Encourage smoking cessation - Monitor for any reduction in smoking with Wellbutrin  use      Other Visit Diagnoses       Piercing in external ear            Assessment & Plan       Nodule of right ear after piercing Nodule behind right ear after piercing three months ago. It is tender but not red or draining. Likely represents displaced tissue or a reaction to the piercing. No signs of infection or cellulitis at this time. - Apply heat to the area with a warm washcloth for 5-10 minutes several times a day to encourage drainage - Monitor for signs of infection such as redness, green drainage, or increased pain, and report if these occur     Return in about 4 weeks (around 08/05/2024) for Anxiety, Depression.         Rockie Agent, MD  [facility] [phone] (phone) [phone] (fax)  [facility]

## 2024-09-02 ENCOUNTER — Ambulatory Visit: Admitting: Family Medicine

## 2024-09-02 ENCOUNTER — Encounter: Payer: Self-pay | Admitting: Family Medicine

## 2024-09-02 VITALS — BP 139/74 | HR 89 | Temp 98.6°F | Ht 66.0 in | Wt 256.7 lb

## 2024-09-02 DIAGNOSIS — F4323 Adjustment disorder with mixed anxiety and depressed mood: Secondary | ICD-10-CM

## 2024-09-02 DIAGNOSIS — R632 Polyphagia: Secondary | ICD-10-CM

## 2024-09-02 DIAGNOSIS — R41840 Attention and concentration deficit: Secondary | ICD-10-CM | POA: Diagnosis not present

## 2024-09-02 DIAGNOSIS — R4584 Anhedonia: Secondary | ICD-10-CM

## 2024-09-02 DIAGNOSIS — F17200 Nicotine dependence, unspecified, uncomplicated: Secondary | ICD-10-CM

## 2024-09-02 DIAGNOSIS — G5603 Carpal tunnel syndrome, bilateral upper limbs: Secondary | ICD-10-CM | POA: Diagnosis not present

## 2024-09-02 MED ORDER — NALTREXONE HCL 50 MG PO TABS: 50.0000 mg | ORAL_TABLET | Freq: Every day | ORAL | 1 refills | Status: AC

## 2024-09-02 NOTE — Progress Notes (Unsigned)
 Established patient visit   Patient: Monique Mcmahon   DOB: 06/16/1986   38 y.o. Female  MRN: [id number] Visit Date: 09/02/2024  Today's healthcare provider: Rockie Agent, MD   Chief Complaint  Patient presents with   Medical Management of Chronic Issues    Patient is present to follow up on anxiety and depression. Reports feeling well since then.  Does notice some insomnia, waking up some during the night, appetite has increased, also notices that she sleeps with her fist clenched and wrist bent which causes some numbness and pain. Does note that libido has increased    Anxiety   Depression   Subjective     HPI     Medical Management of Chronic Issues    Additional comments: Patient is present to follow up on anxiety and depression. Reports feeling well since then.  Does notice some insomnia, waking up some during the night, appetite has increased, also notices that she sleeps with her fist clenched and wrist bent which causes some numbness and pain. Does note that libido has increased       Last edited by Cherry Chiquita HERO, CMA on 09/02/2024  2:39 PM.       Discussed the use of AI scribe software for clinical note transcription with the patient, who gave verbal consent to proceed.  History of Present Illness Monique Mcmahon is a 38 year old female who presents for follow-up of anxiety and depression.  She experiences insomnia characterized by waking up during the night, though she is able to return to sleep without significant disruption to her life. She also notes an increase in appetite and libido.  She describes sleeping with her fists clenched and wrists bent, leading to numbness and pain in both hands, which she believes may be related to her nighttime awakenings. She has been painting rooms in her house, initially suspecting this activity might contribute to her symptoms, but the numbness occurs in both hands.  She has a history of chronic anxiety  and depression and is currently taking Celexa  20 mg daily and Wellbutrin  150 mg daily. She has not used buspirone  yet. She feels much better in terms of mood and anxiety, attributing some improvement to therapy.  She expresses concerns about potential ADD or OCD, noting difficulty focusing on long, tedious tasks and a tendency to complete tasks quickly or hire someone else to do them. She has always been a 'list girl' but now feels unable to accomplish tasks without a list in front of her.  Her mother suggested that her hormones might be imbalanced, as she has experienced similar problems. She has considered hormonal imbalances due to symptoms like increased chin hair growth but has not pursued testing.  Regarding smoking, she notes that while Wellbutrin  was intended to aid in cessation, she has not noticed a decrease in smoking and acknowledges a lack of desire to quit.     Past Medical History:  Diagnosis Date   Generalized anxiety disorder    Herpes genitalis    HSV 2 pos by IgG   Major depression    Pap smear abnormality of cervix with LGSIL 2017   Pap smear abnormality of vagina with ASC-US  2016    Medications: Outpatient Medications Prior to Visit  Medication Sig   buPROPion  (WELLBUTRIN  XL) 150 MG 24 hr tablet Take 1 tablet (150 mg total) by mouth daily.   CALCIUM PO Take by mouth.   citalopram  (CELEXA ) 20 MG tablet Take 1 tablet (  20 mg total) by mouth daily.   clobetasol  (TEMOVATE ) 0.05 % GEL Apply 1 Application topically 2 (two) times daily.   Ferrous Sulfate (IRON PO) Take by mouth.   valACYclovir  (VALTREX ) 500 MG tablet Take 1 tablet (500 mg total) by mouth 2 (two) times daily. For 3 days prn sx   VITAMIN D PO Take by mouth.   [DISCONTINUED] busPIRone  (BUSPAR ) 5 MG tablet Take 1 tablet (5 mg total) by mouth 2 (two) times daily. (Patient not taking: Reported on 09/02/2024)   No facility-administered medications prior to visit.    Review of Systems  Last CBC Lab Results   Component Value Date   WBC 11.7 (H) 02/20/2024   HGB 14.7 02/20/2024   HCT 44.2 02/20/2024   MCV 95 02/20/2024   MCH 31.7 02/20/2024   RDW 12.9 02/20/2024   PLT 365 02/20/2024   Last metabolic panel Lab Results  Component Value Date   GLUCOSE 81 02/20/2024   NA 141 02/20/2024   K 4.8 02/20/2024   CL 102 02/20/2024   CO2 21 02/20/2024   BUN 10 02/20/2024   CREATININE 0.87 02/20/2024   EGFR 87 02/20/2024   CALCIUM 9.5 02/20/2024   PROT 7.1 02/20/2024   ALBUMIN 4.4 02/20/2024   LABGLOB 2.7 02/20/2024   AGRATIO 1.7 02/02/2023   BILITOT 0.3 02/20/2024   ALKPHOS 65 02/20/2024   AST 24 02/20/2024   ALT 35 (H) 02/20/2024   Last lipids Lab Results  Component Value Date   CHOL 300 (H) 02/20/2024   HDL 58 02/20/2024   LDLCALC 183 (H) 02/20/2024   TRIG 305 (H) 02/20/2024   CHOLHDL 5.2 (H) 02/20/2024   Last hemoglobin A1c Lab Results  Component Value Date   HGBA1C 5.5 02/20/2024   Last thyroid functions Lab Results  Component Value Date   TSH 2.140 02/02/2023   FREET4 1.41 02/02/2023   Last vitamin D No results found for: 25OHVITD2, 25OHVITD3, VD25OH Last vitamin B12 and Folate No results found for: VITAMINB12, FOLATE      Objective    BP 139/74 (BP Location: Left Arm, Patient Position: Sitting, Cuff Size: Normal)   Pulse 89   Temp 98.6 F (37 C) (Oral)   Ht 5' 6 (1.676 m)   Wt 256 lb 11.2 oz (116.4 kg)   LMP 08/18/2024 (Approximate)   SpO2 99%   BMI 41.43 kg/m      Physical Exam  Physical Exam CHEST: Clear to auscultation bilaterally, no wheezes or crackles CARDIOVASCULAR: Regular rhythm PSYCHIATRIC: Normal eye contact, pleasant demeanor, normal affect, normal mood    No results found for any visits on 09/02/24.  Assessment & Plan     Problem List Items Addressed This Visit     Adjustment disorder with mixed anxiety and depressed mood - Primary   Adjustment disorder with mixed anxiety and depressed mood with anhedonia Chronic  anxiety and depression with improvement in mood and anxiety symptoms. Reports increased libido and appetite, likely related to Wellbutrin . Insomnia with nocturnal awakenings, possibly due to Wellbutrin 's activating effects. Therapy is beneficial. - Continue Celexa  20 mg daily. - Continue Wellbutrin  150 mg daily. - Discontinued buspirone  as it was not used. - Monitor sleep patterns and address if awakenings become disruptive.      Anhedonia   Attention and concentration deficit   .Chronic  Reports difficulty with focus and memory, requiring lists to accomplish tasks. Discussed potential ADHD and the role of Wellbutrin  as a non-stimulant treatment option. - Referred to Psychology for formal  ADHD evaluation. - Continue Wellbutrin  as it may aid in attention and concentration.      Relevant Orders   Ambulatory referral to Psychology   Bilateral carpal tunnel syndrome   Chronic condition  Bilateral hand numbness and pain, likely due to carpal tunnel syndrome, exacerbated by sleeping with wrists bent. Symptoms may contribute to nocturnal awakenings. - Recommended using a neutralizing wrist brace at night to keep wrists straight. - Instructed to monitor for signs of nerve damage or severe compression, such as weakness or muscle atrophy.      Obesity, morbid, BMI 40.0-49.9 (HCC)   Morbid obesity due to excess calories with polyphagia Chronic  Class III obesity, BMI 41.43 Increased appetite noted, possibly related to Wellbutrin . Discussed potential use of naltrexone in combination with Wellbutrin  to manage appetite and aid in weight loss. - Prescribed naltrexone 50 mg daily to be taken with Wellbutrin  150mg  daily . - Instructed to monitor for any adverse effects and discontinue if necessary      Relevant Medications   naltrexone (DEPADE) 50 MG tablet   Tobacco use disorder   Nicotine dependence Chronic, pt in pre-contemplation stage No significant change in smoking habits despite  Wellbutrin , which is intended to aid in tobacco cessation. Lack of desire to quit smoking noted. - counseled the patient on smoking cessation for 5 mins    .      Other Visit Diagnoses       Increased appetite       Relevant Medications   naltrexone (DEPADE) 50 MG tablet       Assessment & Plan     Return in about 3 months (around 12/03/2024) for appetite, depression/anxiety.      Rockie Agent, MD  [facility] [phone] (phone) [phone] (fax)  [facility]

## 2024-09-02 NOTE — Patient Instructions (Signed)
 To keep you healthy, please keep in mind the following health maintenance items that you are due for:   There are no preventive care reminders to display for this patient.   Best Wishes,   Dr. Lang

## 2024-09-03 DIAGNOSIS — G5603 Carpal tunnel syndrome, bilateral upper limbs: Secondary | ICD-10-CM | POA: Insufficient documentation

## 2024-09-03 NOTE — Assessment & Plan Note (Signed)
.  Chronic  Reports difficulty with focus and memory, requiring lists to accomplish tasks. Discussed potential ADHD and the role of Wellbutrin  as a non-stimulant treatment option. - Referred to Psychology for formal ADHD evaluation. - Continue Wellbutrin  as it may aid in attention and concentration.

## 2024-09-03 NOTE — Assessment & Plan Note (Signed)
 Adjustment disorder with mixed anxiety and depressed mood with anhedonia Chronic anxiety and depression with improvement in mood and anxiety symptoms. Reports increased libido and appetite, likely related to Wellbutrin . Insomnia with nocturnal awakenings, possibly due to Wellbutrin 's activating effects. Therapy is beneficial. - Continue Celexa  20 mg daily. - Continue Wellbutrin  150 mg daily. - Discontinued buspirone  as it was not used. - Monitor sleep patterns and address if awakenings become disruptive.

## 2024-09-03 NOTE — Assessment & Plan Note (Signed)
 Chronic condition  Bilateral hand numbness and pain, likely due to carpal tunnel syndrome, exacerbated by sleeping with wrists bent. Symptoms may contribute to nocturnal awakenings. - Recommended using a neutralizing wrist brace at night to keep wrists straight. - Instructed to monitor for signs of nerve damage or severe compression, such as weakness or muscle atrophy.

## 2024-09-03 NOTE — Assessment & Plan Note (Signed)
 Nicotine dependence Chronic, pt in pre-contemplation stage No significant change in smoking habits despite Wellbutrin , which is intended to aid in tobacco cessation. Lack of desire to quit smoking noted. - counseled the patient on smoking cessation for 5 mins    .

## 2024-09-03 NOTE — Assessment & Plan Note (Signed)
 Morbid obesity due to excess calories with polyphagia Chronic  Class III obesity, BMI 41.43 Increased appetite noted, possibly related to Wellbutrin . Discussed potential use of naltrexone in combination with Wellbutrin  to manage appetite and aid in weight loss. - Prescribed naltrexone 50 mg daily to be taken with Wellbutrin  150mg  daily . - Instructed to monitor for any adverse effects and discontinue if necessary

## 2024-09-26 ENCOUNTER — Encounter: Payer: Self-pay | Admitting: Family Medicine

## 2024-09-26 MED ORDER — POLYMYXIN B-TRIMETHOPRIM 10000-0.1 UNIT/ML-% OP SOLN: 2.0000 [drp] | OPHTHALMIC | 0 refills | Status: AC

## 2024-11-07 ENCOUNTER — Ambulatory Visit: Admitting: Psychology

## 2024-11-07 DIAGNOSIS — F89 Unspecified disorder of psychological development: Secondary | ICD-10-CM | POA: Diagnosis not present
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
## 2024-11-12 ENCOUNTER — Ambulatory Visit: Admitting: Psychology

## 2024-11-12 DIAGNOSIS — F89 Unspecified disorder of psychological development: Secondary | ICD-10-CM

## 2024-11-12 NOTE — Progress Notes (Addendum)
 Date: 11/12/2024   Appointment Start Time: 2pm Duration: 72 minutes Provider: Frederic Fire, PsyD Type of Session: Testing Appointment for Evaluation  Location of Patient: Home Location of Provider: Provider's Home (private office) Type of Contact: Caregility video visit with audio  Session Content: Today's appointment was a telepsychological visit. Monique Mcmahon is aware it is her responsibility to secure confidentiality on her end of the session. Prior to proceeding with today's appointment, Monique Mcmahon's physical location at the time of this appointment was obtained as well a phone number she could be reached at in the event of technical difficulties. Monique Mcmahon denied anyone else being present in the room or on the virtual appointment. This provider reviewed that video should not be captured, photos should not be taken, nor should testing stimuli be copied or recorded as it would be a copyright violation. Monique Mcmahon expressed understanding of the aforementioned, and verbally consented to proceed. The WAIS-5 was administered, scored, and interpreted by this evaluator. Monique Mcmahon is aware of the limitations of teleheath visits and verbally consented to proceed.  Billing codes will be input on the feedback appointment. There are no billing codes for the testing appointment.   Provisional DSM-5 diagnosis(es):  F89 Unspecified Disorder of Psychological Development   Plan: Monique Mcmahon was scheduled for a feedback appointment on 11/19/2024 at 5pm via Caregility video visit with audio.               Frederic ONEIDA Fire, PsyD

## 2024-11-19 ENCOUNTER — Ambulatory Visit: Admitting: Psychology

## 2024-11-19 DIAGNOSIS — F902 Attention-deficit hyperactivity disorder, combined type: Secondary | ICD-10-CM | POA: Diagnosis not present

## 2024-11-19 NOTE — Progress Notes (Signed)
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 a to-do list). Free and premium versions. Unroll.me. Cleans up your email by unsubscribing from what you do not want to receive while still getting everything you do. Free. Finish. It allows you to  divide two-list tasks into short-, mid-, and long-term tasks and determine how much time remains for each. Focus mode hides non-priority tasks.  [employer]ilent. Turns your phone ringer on and off based on specified calendars, geo-fences, timers, etc. $3.99. Freakyalarm. It makes you solve math problems to disable an alarm. $1.99. Wake N Shake. It makes you vigorously shake your phone to stop the alarm. $.99. Todoist. It allows you to add sub-tasks to tasks and includes email and web plugins to make it work across the system. Premium has location-based reminders, calendar sync, productive tracking, etc.  Sleep Cycle. Utilize your phone's motion sensors to catch movement while you are asleep. The alarm will wake you up as early as 30 minutes before your scheduled wake-up time, based on your lightest sleep phase, and show you how daily activities affect your sleep quality.  Colletta. Gamifies mental wellness by letting you choose from a wide variety of self-care exercises to complete, which it rewards with the ability to care for a virtual pet. It also includes features such as mood tracking, breathing exercises, guided journaling, and setting mutual goals with friends. Books: Taking Charge of Adult ADHD Second Edition by Dr. Nelwyn Pica The ADHD Effect on Marriage by Eleanor Bowers The Couples Guide to Thriving with ADHD by Eleanor Bowers The ADHD Guide to Career Success by Dr. Rollo Fess  Organizations that are a reliable source of information on ADHD:  Children and Adults with Attention-Deficit/Hyperactivity Disorder (CHADD): chadd.org  Attention Deficit Disorder Association (ADDA): hotternames.de ADD Resources: addresources.org ADD WareHouse: addwarehouse.com World Federation of ADHD: adhd-federation.org ADDConsults: addconsults.com Compilation of ADHD resources: https://www.harrell.com/ Future evaluation, if deemed necessary, and/or to determine the effectiveness  of recommended interventions.  Frederic Fire, Psy.D. Licensed Psychologist - [facility] [phone] References  Pica, R. A. (2021). Taking charge of adult ADHD: proven strategies to succeed at work, at   home, and in relationships (pp. 6-10 and 272-276). [employer].              Frederic ONEIDA Fire, PsyD

## 2024-12-03 ENCOUNTER — Ambulatory Visit: Admitting: Family Medicine

## 2025-06-15 ENCOUNTER — Ambulatory Visit: Admitting: Dermatology
